# Patient Record
Sex: Male | Born: 1970
Health system: Southern US, Community
[De-identification: ages and names within clinical notes are randomized; demographics above are authoritative.]

## PROBLEM LIST (undated history)

## (undated) DIAGNOSIS — N529 Male erectile dysfunction, unspecified: Secondary | ICD-10-CM

## (undated) DIAGNOSIS — L659 Nonscarring hair loss, unspecified: Secondary | ICD-10-CM

## (undated) DIAGNOSIS — H9319 Tinnitus, unspecified ear: Secondary | ICD-10-CM

## (undated) DIAGNOSIS — E291 Testicular hypofunction: Secondary | ICD-10-CM

## (undated) DIAGNOSIS — M51369 Other intervertebral disc degeneration, lumbar region without mention of lumbar back pain or lower extremity pain: Secondary | ICD-10-CM

## (undated) DIAGNOSIS — G7111 Myotonic muscular dystrophy: Secondary | ICD-10-CM

## (undated) DIAGNOSIS — M5136 Other intervertebral disc degeneration, lumbar region: Secondary | ICD-10-CM

## (undated) DIAGNOSIS — R001 Bradycardia, unspecified: Secondary | ICD-10-CM

## (undated) DIAGNOSIS — M5126 Other intervertebral disc displacement, lumbar region: Secondary | ICD-10-CM

## (undated) DIAGNOSIS — E786 Lipoprotein deficiency: Secondary | ICD-10-CM

## (undated) DIAGNOSIS — K589 Irritable bowel syndrome without diarrhea: Secondary | ICD-10-CM

## (undated) HISTORY — PX: WISDOM TOOTH EXTRACTION: SHX21

## (undated) HISTORY — DX: Irritable bowel syndrome, unspecified: K58.9

## (undated) HISTORY — DX: Lipoprotein deficiency: E78.6

## (undated) HISTORY — DX: Bradycardia, unspecified: R00.1

## (undated) HISTORY — DX: Tinnitus, unspecified ear: H93.19

## (undated) HISTORY — PX: ROOT CANAL: SHX2363

## (undated) HISTORY — DX: Nonscarring hair loss, unspecified: L65.9

## (undated) HISTORY — PX: KNEE ARTHROSCOPY: SUR90

## (undated) HISTORY — DX: Other intervertebral disc degeneration, lumbar region without mention of lumbar back pain or lower extremity pain: M51.369

## (undated) HISTORY — DX: Male erectile dysfunction, unspecified: N52.9

## (undated) HISTORY — DX: Other intervertebral disc degeneration, lumbar region: M51.36

## (undated) HISTORY — DX: Testicular hypofunction: E29.1

## (undated) HISTORY — DX: Myotonic muscular dystrophy: G71.11

## (undated) HISTORY — PX: OTHER SURGICAL HISTORY: SHX169

## (undated) HISTORY — DX: Other intervertebral disc displacement, lumbar region: M51.26

---

## 2010-02-24 ENCOUNTER — Encounter (INDEPENDENT_AMBULATORY_CARE_PROVIDER_SITE_OTHER): Payer: Self-pay | Admitting: *Deleted

## 2010-02-24 ENCOUNTER — Ambulatory Visit (HOSPITAL_COMMUNITY): Payer: BC Managed Care – PPO | Attending: Neurology

## 2010-02-24 ENCOUNTER — Encounter: Payer: Self-pay | Admitting: Cardiology

## 2010-02-24 ENCOUNTER — Encounter (INDEPENDENT_AMBULATORY_CARE_PROVIDER_SITE_OTHER): Payer: BC Managed Care – PPO

## 2010-02-24 DIAGNOSIS — I059 Rheumatic mitral valve disease, unspecified: Secondary | ICD-10-CM | POA: Insufficient documentation

## 2010-02-24 DIAGNOSIS — I379 Nonrheumatic pulmonary valve disorder, unspecified: Secondary | ICD-10-CM | POA: Insufficient documentation

## 2010-02-24 DIAGNOSIS — G7111 Myotonic muscular dystrophy: Secondary | ICD-10-CM | POA: Insufficient documentation

## 2010-02-24 DIAGNOSIS — R9431 Abnormal electrocardiogram [ECG] [EKG]: Secondary | ICD-10-CM

## 2010-02-24 DIAGNOSIS — I079 Rheumatic tricuspid valve disease, unspecified: Secondary | ICD-10-CM | POA: Insufficient documentation

## 2010-03-04 NOTE — Assessment & Plan Note (Signed)
Summary: ekg. 359.21. [facility]. per dana office [phone]. dana w...  Nurse Visit  Comments EKG done, copy made for our records pt given orginal to take back to [facility] by Ledon Snare, RN

## 2013-04-02 ENCOUNTER — Other Ambulatory Visit: Payer: Self-pay | Admitting: Orthopedic Surgery

## 2013-04-02 DIAGNOSIS — M25562 Pain in left knee: Secondary | ICD-10-CM

## 2013-04-10 ENCOUNTER — Other Ambulatory Visit: Payer: Self-pay | Admitting: Orthopedic Surgery

## 2013-04-10 ENCOUNTER — Ambulatory Visit
Admission: RE | Admit: 2013-04-10 | Discharge: 2013-04-10 | Disposition: A | Discharge status: Home / Self Care | Payer: BC Managed Care – PPO | Source: Ambulatory Visit | Attending: Orthopedic Surgery | Admitting: Orthopedic Surgery

## 2013-04-10 DIAGNOSIS — T1590XA Foreign body on external eye, part unspecified, unspecified eye, initial encounter: Secondary | ICD-10-CM

## 2013-04-10 DIAGNOSIS — M25562 Pain in left knee: Secondary | ICD-10-CM

## 2013-04-30 ENCOUNTER — Telehealth: Payer: Self-pay | Admitting: Interventional Cardiology

## 2013-04-30 NOTE — Telephone Encounter (Signed)
New message  Pt called states that he is having knee surgery on the 05/08/2013. Will undergo anesthesia. Pt wanted Dr. Tamala Julian to be aware of the surgery because he has Myotonic dystrophy. Request a call back to discuss

## 2013-05-01 NOTE — Telephone Encounter (Signed)
returned pt call.pt sts that he has an upcoming knee surgery and wants Dr.Smith to know.pt sts that cardiac clearance was not requested by the surgeon, but with his condition of myotonic dystrophy. there is a concern.adv pt Dr.Smith will be back in the office early next week. I will fwd him a message.pt sts that his surgery is scheduled for 05/08/13 if cleared by Dr.Smith clearance should be faxed to [phone]

## 2013-05-01 NOTE — Telephone Encounter (Signed)
returned pt call.lmom  for pt to call back

## 2013-05-01 NOTE — Telephone Encounter (Signed)
Follow up     Pt is aware Dr Tamala Julian is out of the office until next week.  However, he wanted to know if someone else can say it is ok for him to have surgery and go under anesthesia even though he has myotonic dystrophy.

## 2013-05-07 NOTE — Telephone Encounter (Signed)
I don't know how to respond. Have not seen recently. If surgeon is okay with operating, I am too.

## 2013-05-09 NOTE — Telephone Encounter (Signed)
pt sts that he had the surgery and they surgeon gave local anesthesia.pt sts he is doing well.

## 2013-08-12 ENCOUNTER — Encounter: Payer: Self-pay | Admitting: Interventional Cardiology

## 2013-08-12 ENCOUNTER — Ambulatory Visit (INDEPENDENT_AMBULATORY_CARE_PROVIDER_SITE_OTHER): Payer: BC Managed Care – PPO | Admitting: Interventional Cardiology

## 2013-08-12 VITALS — BP 135/82 | HR 55 | Ht 70.0 in | Wt 190.8 lb

## 2013-08-12 DIAGNOSIS — N529 Male erectile dysfunction, unspecified: Secondary | ICD-10-CM

## 2013-08-12 DIAGNOSIS — G7111 Myotonic muscular dystrophy: Secondary | ICD-10-CM

## 2013-08-12 DIAGNOSIS — E786 Lipoprotein deficiency: Secondary | ICD-10-CM | POA: Insufficient documentation

## 2013-08-12 DIAGNOSIS — Z79899 Other long term (current) drug therapy: Secondary | ICD-10-CM

## 2013-08-12 NOTE — Patient Instructions (Signed)
Your physician recommends that you continue on your current medications as directed. Please refer to the Current Medication list given to you today.  Your physician wants you to follow-up in: 1 year with Dr.Smith You will receive a reminder letter in the mail two months in advance. If you don't receive a letter, please call our office to schedule the follow-up appointment.  

## 2013-08-12 NOTE — Progress Notes (Signed)
Patient ID: Don Grant, male   DOB: [birth year], 43 y.o.   MRN: [id number]    [address], [address] [place], [place]  [address] Phone: [phone] Fax:  [phone]  Date:  08/12/2013   ID:  [name], DOB Jul 30, 1970, MRN [id number]  PCP:  Vidal Schwalbe, MD   ASSESSMENT:  1. Mexiletine therapy. No EKG evidence of conduction abnormality 2. Myotonic dystrophy, stable 3. Sinus bradycardia  PLAN:  1. EKG in one year   SUBJECTIVE: Don Grant is a 43 y.o. male is asymptomatic from cardiac standpoint. Had a left knee arthroscopy and also endodontic surgery. He denies any specific complaints. No side effects to medications.   Wt Readings from Last 3 Encounters:  08/12/13 190 lb 12.8 oz (86.546 kg)     Past Medical History  Diagnosis Date  . Bradycardia     asymptomatic  . Drug therapy     no cardiac side effects or containdications  . IBS (irritable bowel syndrome)     diarrhea prone  . Balding     early ( saw Jarome Matin in the past chose not to stay on Propecia )  . Myotonic dystrophy, type 1     on Mexiletine followed  by Dr .Tamala Julian, and [facility] and at [facility]   . Low HDL (under 40)   . Tinnitus     (may be side effect of mexilitene)  . Erectile dysfunction     Current Outpatient Prescriptions  Medication Sig Dispense Refill  . Coenzyme Q10 (CO Q 10 PO) Take by mouth. Co Q 10 Mega Capsule 1 capsule daily      . fluticasone (FLONASE) 50 MCG/ACT nasal spray Place into both nostrils daily. As needed for allergies      . Melatonin 3 MG CAPS Take by mouth. Take 2 tablets every night      . mexiletine (MEXITIL) 150 MG capsule Take 150 mg by mouth 3 (three) times daily.      . Multiple Vitamin (MULTIVITAMIN) LIQD Take 5 mLs by mouth daily.      . Omega 3 1000 MG CAPS Take by mouth. Take 3 tablets      . sildenafil (VIAGRA) 100 MG tablet Take 100 mg by mouth daily as needed for erectile dysfunction.       No current facility-administered  medications for this visit.    Allergies:    Allergies  Allergen Reactions  . Phenytoin     Suicidal thoughts    Social History:  The patient  reports that he has never smoked. He does not have any smokeless tobacco history on file. He reports that he drinks alcohol.   ROS:  Please see the history of present illness.      All other systems reviewed and negative.   OBJECTIVE: VS:  BP 135/82  Pulse 55  Ht 5' 10"  (1.778 m)  Wt 190 lb 12.8 oz (86.546 kg)  BMI 27.38 kg/m2 Well nourished, well developed, in no acute distress, healthy-appearing HEENT: normal Neck: JVD flat. Carotid bruit absent  Cardiac:  normal S1, S2; RRR; no murmur Lungs:  clear to auscultation bilaterally, no wheezing, rhonchi or rales Abd: soft, nontender, no hepatomegaly Ext: Edema absent. Pulses 2+ bilateral Skin: warm and dry Neuro:  CNs 2-12 intact, no focal abnormalities noted  EKG:  Normal sinus rhythm/sinus bradycardia with minimal interventricular conduction delay       Signed, Illene Labrador III, MD 08/12/2013 2:57 PM

## 2014-09-01 ENCOUNTER — Ambulatory Visit: Payer: Self-pay | Admitting: Interventional Cardiology

## 2014-10-12 NOTE — Progress Notes (Signed)
No show

## 2014-10-16 ENCOUNTER — Ambulatory Visit: Payer: Self-pay | Admitting: Interventional Cardiology

## 2014-11-05 ENCOUNTER — Encounter: Payer: Self-pay | Admitting: Physician Assistant

## 2014-11-05 NOTE — Progress Notes (Signed)
Cardiology Office Note Date:  11/06/2014  Patient ID:  Don Grant, DOB 1970-03-04, MRN [id number] PCP:  Vidal Schwalbe, MD  Cardiologist:  Dr. Tamala Julian   Chief Complaint: f/u due to mexiletine  History of Present Illness: [name] is a 44 y.o. male with history of myotonic muscular dystrophy, low HDL, sinus bradycardia who presents for follow-up. He is followed by Dr. Tamala Julian due to being on mexiletine for his muscular dystrophy.  He was originally diagnosed with MMD around age 21 after he began having episodic jaw tightening and problems from his shoulders up. He was tried on muscle relaxers by neurology but it was only after his sister was diagnosed with myotonic muscular dystrophy that he was found to have the disease as well. He has had good relief of symptoms with mexiletine. He also has found this medicine has relieved all IBS symptoms as well. He remains physically fit and eats a good diet. He has not had any CP, SOB, palpitations, near-syncope or syncope.   Past Medical History  Diagnosis Date  . Sinus bradycardia     asymptomatic  . IBS (irritable bowel syndrome)     diarrhea prone  . Balding     early ( saw Jarome Matin in the past chose not to stay on Propecia )  . Myotonic dystrophy, type 1 ([place])     on Mexiletine followed  by Dr .Tamala Julian, and [facility] and at [facility]   . Low HDL (under 40)   . Tinnitus     (may be side effect of mexilitene)  . Erectile dysfunction     Past Surgical History  Procedure Laterality Date  . Wisdom tooth extraction    . Root canal    . Apico on tooth    . Knee arthroscopy      Current Outpatient Prescriptions  Medication Sig Dispense Refill  . Coenzyme Q10 (CO Q 10 PO) Take 1 capsule by mouth daily.     . fluticasone (FLONASE) 50 MCG/ACT nasal spray Place 2 sprays into both nostrils daily. As needed for allergies    . Glucosamine-Chondroit-Vit C-Mn (GLUCOSAMINE CHONDR 1500 COMPLX PO) Take 3 tablets by mouth daily.     . Melatonin 3 MG CAPS Take 1 capsule by mouth at bedtime.     . mexiletine (MEXITIL) 150 MG capsule Take 150 mg by mouth 3 (three) times daily.    . Multiple Vitamin (MULTIVITAMIN) LIQD Take 5 mLs by mouth daily.    . Omega 3 1000 MG CAPS Take 1 capsule by mouth daily. Take 3 tablets    . sildenafil (VIAGRA) 100 MG tablet Take 100 mg by mouth daily as needed for erectile dysfunction.     No current facility-administered medications for this visit.    Allergies:   Phenytoin   Social History:  The patient  reports that he has never smoked. He does not have any smokeless tobacco history on file. He reports that he drinks alcohol.   Family History:  The patient's family history includes Arthritis in his maternal grandfather; CAD in his maternal grandmother; Cataracts in his maternal grandfather and maternal grandmother; Depression in his mother; Healthy in his daughter and father; Heart Problems in his maternal grandfather; Muscular dystrophy in his sister; Myopathy in his sister; Obesity in his mother; Other in his paternal grandmother.  ROS:  Please see the history of present illness.  All other systems are reviewed and otherwise negative.   PHYSICAL EXAM:  VS:  BP 128/66  mmHg  Pulse 45  Ht 5' 10"  (1.778 m)  Wt 185 lb 9.6 oz (84.188 kg)  BMI 26.63 kg/m2 BMI: Body mass index is 26.63 kg/(m^2). Well nourished, well developed fit appearing WM, in no acute distress HEENT: normocephalic, atraumatic Neck: no JVD, carotid bruits or masses Cardiac:  normal S1, S2; RRR; no murmurs, rubs, or gallops Lungs:  clear to auscultation bilaterally, no wheezing, rhonchi or rales Abd: soft, nontender, no hepatomegaly, + BS MS: no deformity or atrophy Ext: no edema Skin: warm and dry, no rash Neuro:  moves all extremities spontaneously, no focal abnormalities noted, follows commands Psych: euthymic mood, full affect  EKG:  Done today shows sinus bradycardia 45bpm, QTc 456m, PR 1829m TWI III, no  acute ST-T changes  Recent Labs: No results found for requested labs within last 365 days.  No results found for requested labs within last 365 days.   CrCl cannot be calculated (Patient has no serum creatinine result on file.).   Wt Readings from Last 3 Encounters:  11/06/14 185 lb 9.6 oz (84.188 kg)  08/12/13 190 lb 12.8 oz (86.546 kg)     Other studies reviewed: Additional studies/records reviewed today include: summarized above  ASSESSMENT AND PLAN:  1. Myotonic dystrophy - maintained on mexiletine with good clinical outcome. No cardiovascular symptoms reported. Has not had recent labwork since earlier this year. Will check BMET and Mg to make sure we are near goal of K>4, Mg >1.8. I also discussed the patient's case with Dr. AlRayann Hemanho recommends a baseline echocardiogram since is on this medicine. The patient and I discussed observation for symptoms of palpitations and dizziness/syncope while taking this medicine - he will let usKoreanow if he develops any problems. 2. Sinus bradycardia - chronic, completely asymptomatic. Follow. 3. H/o low HDL - treated with fish oil and followed by primary care.  Disposition: F/u with Dr. SmTamala Juliann 1 year.  Current medicines are reviewed at length with the patient today.  The patient did not have any concerns regarding medicines.  SiRaechel AcheA-C 11/06/2014 11:19 AM     [facility] [address] [place] [phone])  ([phone])

## 2014-11-06 ENCOUNTER — Encounter: Payer: Self-pay | Admitting: *Deleted

## 2014-11-06 ENCOUNTER — Ambulatory Visit (INDEPENDENT_AMBULATORY_CARE_PROVIDER_SITE_OTHER): Payer: 59 | Admitting: Physician Assistant

## 2014-11-06 DIAGNOSIS — E786 Lipoprotein deficiency: Secondary | ICD-10-CM

## 2014-11-06 DIAGNOSIS — R001 Bradycardia, unspecified: Secondary | ICD-10-CM | POA: Diagnosis not present

## 2014-11-06 DIAGNOSIS — G7111 Myotonic muscular dystrophy: Secondary | ICD-10-CM | POA: Diagnosis not present

## 2014-11-06 LAB — BASIC METABOLIC PANEL
BUN: 29 mg/dL — ABNORMAL HIGH (ref 7–25)
CO2: 28 mmol/L (ref 20–31)
Calcium: 9.4 mg/dL (ref 8.6–10.3)
Chloride: 105 mmol/L (ref 98–110)
Creat: 1.11 mg/dL (ref 0.60–1.35)
GLUCOSE: 85 mg/dL (ref 65–99)
POTASSIUM: 4.2 mmol/L (ref 3.5–5.3)
SODIUM: 142 mmol/L (ref 135–146)

## 2014-11-06 LAB — MAGNESIUM: Magnesium: 2.3 mg/dL (ref 1.5–2.5)

## 2014-11-06 NOTE — Patient Instructions (Addendum)
Medication Instructions:  Your physician recommends that you continue on your current medications as directed. Please refer to the Current Medication list given to you today.   Labwork: TODAY:  BMET                MAGNESIUM  Testing/Procedures: Your physician has requested that you have an echocardiogram. Echocardiography is a painless test that uses sound waves to create images of your heart. It provides your doctor with information about the size and shape of your heart and how well your heart's chambers and valves are working. This procedure takes approximately one hour. There are no restrictions for this procedure.   Follow-Up: Your physician wants you to follow-up in:  [place].  You will receive a reminder letter in the mail two months in advance. If you don't receive a letter, please call our office to schedule the follow-up appointment.   Special Information:  Echocardiogram An echocardiogram, or echocardiography, uses sound waves (ultrasound) to produce an image of your heart. The echocardiogram is simple, painless, obtained within a short period of time, and offers valuable information to your health care provider. The images from an echocardiogram can provide information such as:  Evidence of coronary artery disease (CAD).  Heart size.  Heart muscle function.  Heart valve function.  Aneurysm detection.  Evidence of a past heart attack.  Fluid buildup around the heart.  Heart muscle thickening.  Assess heart valve function. LET [facility] CARE PROVIDER KNOW ABOUT:  Any allergies you have.  All medicines you are taking, including vitamins, herbs, eye drops, creams, and over-the-counter medicines.  Previous problems you or members of your family have had with the use of anesthetics.  Any blood disorders you have.  Previous surgeries you have had.  Medical conditions you have.  Possibility of pregnancy, if this applies. BEFORE THE PROCEDURE   No special preparation is needed. Eat and drink normally.  PROCEDURE   In order to produce an image of your heart, gel will be applied to your chest and a wand-like tool (transducer) will be moved over your chest. The gel will help transmit the sound waves from the transducer. The sound waves will harmlessly bounce off your heart to allow the heart images to be captured in real-time motion. These images will then be recorded.  You may need an IV to receive a medicine that improves the quality of the pictures. AFTER THE PROCEDURE You may return to your normal schedule including diet, activities, and medicines, unless your health care provider tells you otherwise.   This information is not intended to replace advice given to you by your health care provider. Make sure you discuss any questions you have with your health care provider.   Document Released: 01/01/2000 Document Revised: 01/24/2014 Document Reviewed: 09/10/2012 Elsevier Interactive Patient Education [employer].

## 2014-11-18 ENCOUNTER — Ambulatory Visit (HOSPITAL_COMMUNITY): Payer: 59 | Attending: Physician Assistant

## 2014-11-18 ENCOUNTER — Other Ambulatory Visit: Payer: Self-pay

## 2014-11-18 DIAGNOSIS — G7111 Myotonic muscular dystrophy: Secondary | ICD-10-CM | POA: Insufficient documentation

## 2014-11-18 DIAGNOSIS — I517 Cardiomegaly: Secondary | ICD-10-CM | POA: Diagnosis not present

## 2014-11-27 ENCOUNTER — Other Ambulatory Visit: Payer: Self-pay | Admitting: Family Medicine

## 2014-11-27 ENCOUNTER — Ambulatory Visit
Admission: RE | Admit: 2014-11-27 | Discharge: 2014-11-27 | Disposition: A | Discharge status: Home / Self Care | Payer: 59 | Source: Ambulatory Visit | Attending: Family Medicine | Admitting: Family Medicine

## 2014-11-27 DIAGNOSIS — R05 Cough: Secondary | ICD-10-CM

## 2014-11-27 DIAGNOSIS — R059 Cough, unspecified: Secondary | ICD-10-CM

## 2014-11-28 ENCOUNTER — Telehealth: Payer: Self-pay | Admitting: Interventional Cardiology

## 2014-11-28 NOTE — Telephone Encounter (Signed)
Notified of echo results.

## 2014-11-28 NOTE — Telephone Encounter (Signed)
New problem   Pt want to know results of Echo he had done 11.1.16. Please advise

## 2015-11-16 ENCOUNTER — Other Ambulatory Visit: Payer: Self-pay | Admitting: Orthopedic Surgery

## 2015-11-16 DIAGNOSIS — M545 Low back pain, unspecified: Secondary | ICD-10-CM

## 2015-11-16 DIAGNOSIS — G8929 Other chronic pain: Secondary | ICD-10-CM

## 2015-11-21 ENCOUNTER — Ambulatory Visit
Admission: RE | Admit: 2015-11-21 | Discharge: 2015-11-21 | Disposition: A | Discharge status: Home / Self Care | Payer: 59 | Source: Ambulatory Visit | Attending: Orthopedic Surgery | Admitting: Orthopedic Surgery

## 2015-11-21 DIAGNOSIS — G8929 Other chronic pain: Secondary | ICD-10-CM

## 2015-11-21 DIAGNOSIS — M545 Low back pain, unspecified: Secondary | ICD-10-CM

## 2015-12-02 DIAGNOSIS — Z5181 Encounter for therapeutic drug level monitoring: Secondary | ICD-10-CM | POA: Insufficient documentation

## 2015-12-03 ENCOUNTER — Encounter (INDEPENDENT_AMBULATORY_CARE_PROVIDER_SITE_OTHER): Payer: Self-pay

## 2015-12-03 ENCOUNTER — Ambulatory Visit (INDEPENDENT_AMBULATORY_CARE_PROVIDER_SITE_OTHER): Payer: 59 | Admitting: Interventional Cardiology

## 2015-12-03 ENCOUNTER — Encounter: Payer: Self-pay | Admitting: Interventional Cardiology

## 2015-12-03 VITALS — BP 122/80 | HR 47 | Ht 70.0 in | Wt 181.4 lb

## 2015-12-03 DIAGNOSIS — R001 Bradycardia, unspecified: Secondary | ICD-10-CM

## 2015-12-03 DIAGNOSIS — G7111 Myotonic muscular dystrophy: Secondary | ICD-10-CM

## 2015-12-03 DIAGNOSIS — Z5181 Encounter for therapeutic drug level monitoring: Secondary | ICD-10-CM | POA: Diagnosis not present

## 2015-12-03 DIAGNOSIS — N5201 Erectile dysfunction due to arterial insufficiency: Secondary | ICD-10-CM | POA: Diagnosis not present

## 2015-12-03 DIAGNOSIS — E786 Lipoprotein deficiency: Secondary | ICD-10-CM

## 2015-12-03 NOTE — Patient Instructions (Signed)
Medication Instructions:  None  Labwork: None  Testing/Procedures: None  Follow-Up: Your physician wants you to follow-up in: 1 year with Dr. Tamala Julian.  You will receive a reminder letter in the mail two months in advance. If you don't receive a letter, please call our office to schedule the follow-up appointment.   Any Other Special Instructions Will Be Listed Below (If Applicable).     If you need a refill on your cardiac medications before your next appointment, please call your pharmacy.

## 2015-12-03 NOTE — Progress Notes (Signed)
Cardiology Office Note    Date:  12/03/2015   ID:  Don Grant, DOB 04/02/1970, MRN [id number]  PCP:  Don Schwalbe, MD  Cardiologist: Sinclair Grooms, MD   Chief Complaint  Patient presents with  . Follow-up    Mexitil    History of Present Illness:  Don Grant is a 45 y.o. male with history of myotonic muscular dystrophy, low HDL, sinus bradycardia who presents for follow-up. Follow-up is for monitoring of Mexitil therapy  He is doing well. He has had some hormonal issues. He is found that testosterone levels are low. He has gone through many types of diet changes.   Past Medical History:  Diagnosis Date  . Balding    early ( saw Jarome Grant in the past chose not to stay on Propecia )  . Erectile dysfunction   . IBS (irritable bowel syndrome)    diarrhea prone  . Low HDL (under 40)   . Myotonic dystrophy, type 1 ([place])    on Mexiletine followed  by Dr .Tamala Julian, and [facility] and at [facility]   . Sinus bradycardia    asymptomatic  . Tinnitus    (may be side effect of mexilitene)    Past Surgical History:  Procedure Laterality Date  . APICO ON TOOTH    . KNEE ARTHROSCOPY    . ROOT CANAL    . WISDOM TOOTH EXTRACTION      Current Medications: Outpatient Medications Prior to Visit  Medication Sig Dispense Refill  . Coenzyme Q10 (CO Q 10 PO) Take 1 capsule by mouth daily.     . fluticasone (FLONASE) 50 MCG/ACT nasal spray Place 2 sprays into both nostrils daily. As needed for allergies    . Glucosamine-Chondroit-Vit C-Mn (GLUCOSAMINE CHONDR 1500 COMPLX PO) Take 3 tablets by mouth daily.    . Melatonin 3 MG CAPS Take 1 capsule by mouth at bedtime.     . mexiletine (MEXITIL) 150 MG capsule Take 150 mg by mouth 3 (three) times daily.    . Multiple Vitamin (MULTIVITAMIN) LIQD Take 5 mLs by mouth daily.    . sildenafil (VIAGRA) 100 MG tablet Take 100 mg by mouth daily as needed for erectile dysfunction.    . Omega 3 1000 MG CAPS Take 1 capsule by  mouth daily. Take 3 tablets     No facility-administered medications prior to visit.      Allergies:   Phenytoin   Social History   Social History  . Marital status: Married    Spouse name: N/A  . Number of children: 2  . Years of education: N/A   Social History Main Topics  . Smoking status: Never Smoker  . Smokeless tobacco: Never Used  . Alcohol use Yes     Comment: patient drinks 1 glass of red wine every night  . Drug use: Unknown  . Sexual activity: Not Asked   Other Topics Concern  . None   Social History Narrative   Tobacco use    Cigarettes: never smoked    No smoking   Alcohol : yes,red wine-8oz every night    Recreational drug use :no   Exercise: cardio 3-4 times per week,30 -50 minutes   Strength training 3 times per week.   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   ROS:   Please see the history of present illness.    Chronic back pain. Myotonic dystrophy.  All other systems reviewed and are negative.   PHYSICAL EXAM:   VS:  BP 122/80   Pulse (!) 47   Ht 5' 10"  (1.778 m)   Wt 181 lb 6.4 oz (82.3 kg)   BMI 26.03 kg/m    GEN: Well nourished, well developed, in no acute distress  HEENT: normal  Neck: no JVD, carotid bruits, or masses Cardiac: RRR; no murmurs, rubs, or gallops,no  edema  Respiratory:  clear to auscultation bilaterally, normal work of breathing GI: soft, nontender, nondistended, + BS MS: no deformity or atrophy  Skin: warm and dry, no rash Neuro:  Alert and Oriented x 3, Strength and sensation are intact Psych: euthymic mood, full affect  Wt Readings from Last 3 Encounters:  12/03/15 181 lb 6.4 oz (82.3 kg)  11/06/14 185 lb 9.6 oz (84.2 kg)  08/12/13 190 lb 12.8 oz (86.5 kg)      Studies/Labs Reviewed:   EKG:  EKG  Sinus bradycardia at 47 bpm. Otherwise unremarkable. No change compared to prior.  Recent Labs: No results found for requested labs within last 8760 hours.   Lipid Panel No results found for: CHOL, TRIG, HDL, CHOLHDL, VLDL, LDLCALC, LDLDIRECT  Additional studies/ records that were reviewed today include:  No new data    ASSESSMENT:    1. Myotonic muscular dystrophy ([place])   2. Medication monitoring encounter   3. Erectile dysfunction due to arterial insufficiency   4. Low HDL (under 40)      PLAN:  In order of problems listed above:  1. No evidence of medication side effect or toxicity. Continue an active lifestyle. Call if syncope, palpitations, or other complaints. 2. Partially contributed to by Mexitil (potential). He has a high vagal tone as well, related to physical training.    Medication Adjustments/Labs and Tests Ordered: Current medicines are reviewed at length with the patient today.  Concerns regarding medicines are outlined above.  Medication changes, Labs and Tests ordered today are listed in the Patient Instructions below. There are no Patient Instructions on file for this visit.   Signed, Sinclair Grooms, MD  12/03/2015 10:09 AM    [facility] [address], [place], [place]  [address] Phone: [phone]; Fax: [phone]

## 2016-02-09 ENCOUNTER — Other Ambulatory Visit: Payer: Self-pay | Admitting: Neurological Surgery

## 2016-02-09 DIAGNOSIS — M5136 Other intervertebral disc degeneration, lumbar region: Secondary | ICD-10-CM

## 2016-02-12 ENCOUNTER — Ambulatory Visit
Admission: RE | Admit: 2016-02-12 | Discharge: 2016-02-12 | Disposition: A | Discharge status: Home / Self Care | Payer: 59 | Source: Ambulatory Visit | Attending: Neurological Surgery | Admitting: Neurological Surgery

## 2016-02-12 DIAGNOSIS — M5136 Other intervertebral disc degeneration, lumbar region: Secondary | ICD-10-CM | POA: Diagnosis not present

## 2016-02-12 MED ORDER — METHYLPREDNISOLONE ACETATE 40 MG/ML INJ SUSP (RADIOLOG
120.0000 mg | Freq: Once | INTRAMUSCULAR | Status: AC
  Administered 2016-02-12: 120 mg via EPIDURAL

## 2016-02-12 MED ORDER — IOPAMIDOL (ISOVUE-M 200) INJECTION 41%
1.0000 mL | Freq: Once | INTRAMUSCULAR | Status: AC
  Administered 2016-02-12: 1 mL via EPIDURAL

## 2016-02-12 NOTE — Discharge Instructions (Signed)

## 2016-04-08 DIAGNOSIS — E291 Testicular hypofunction: Secondary | ICD-10-CM | POA: Diagnosis not present

## 2016-04-08 DIAGNOSIS — Z79899 Other long term (current) drug therapy: Secondary | ICD-10-CM | POA: Diagnosis not present

## 2016-04-08 DIAGNOSIS — Z0000 Encounter for general adult medical examination without abnormal findings: Secondary | ICD-10-CM | POA: Diagnosis not present

## 2016-04-08 DIAGNOSIS — Z125 Encounter for screening for malignant neoplasm of prostate: Secondary | ICD-10-CM | POA: Diagnosis not present

## 2016-08-09 DIAGNOSIS — R030 Elevated blood-pressure reading, without diagnosis of hypertension: Secondary | ICD-10-CM | POA: Diagnosis not present

## 2016-10-03 IMAGING — CR DG CHEST 2V
2 series · 2 of 2 positions shown · non-contrast
Comparison: 11/12/2007.

CLINICAL DATA: Cough.

EXAM:
CHEST  2 VIEW

[w chest pa]
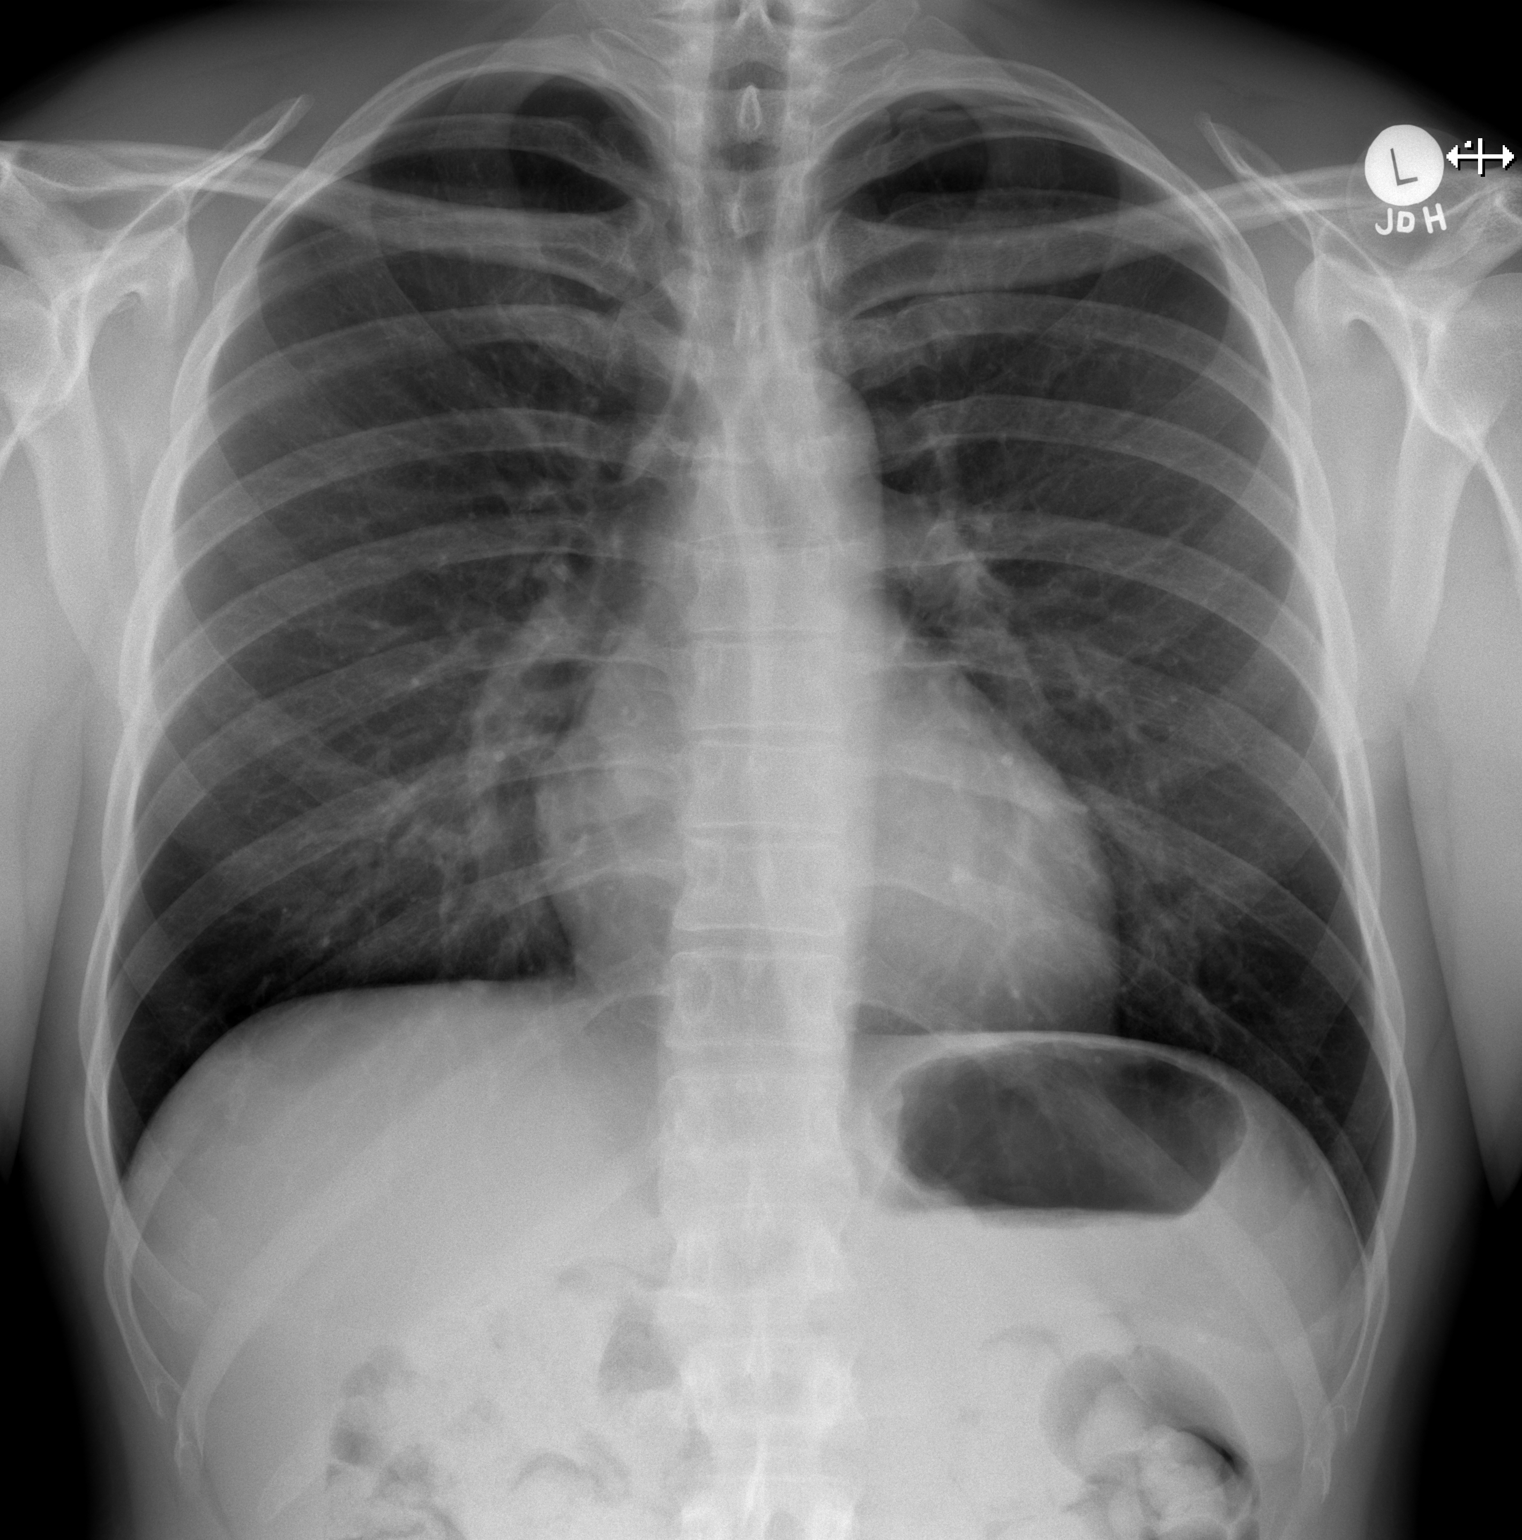

[w chest lat]
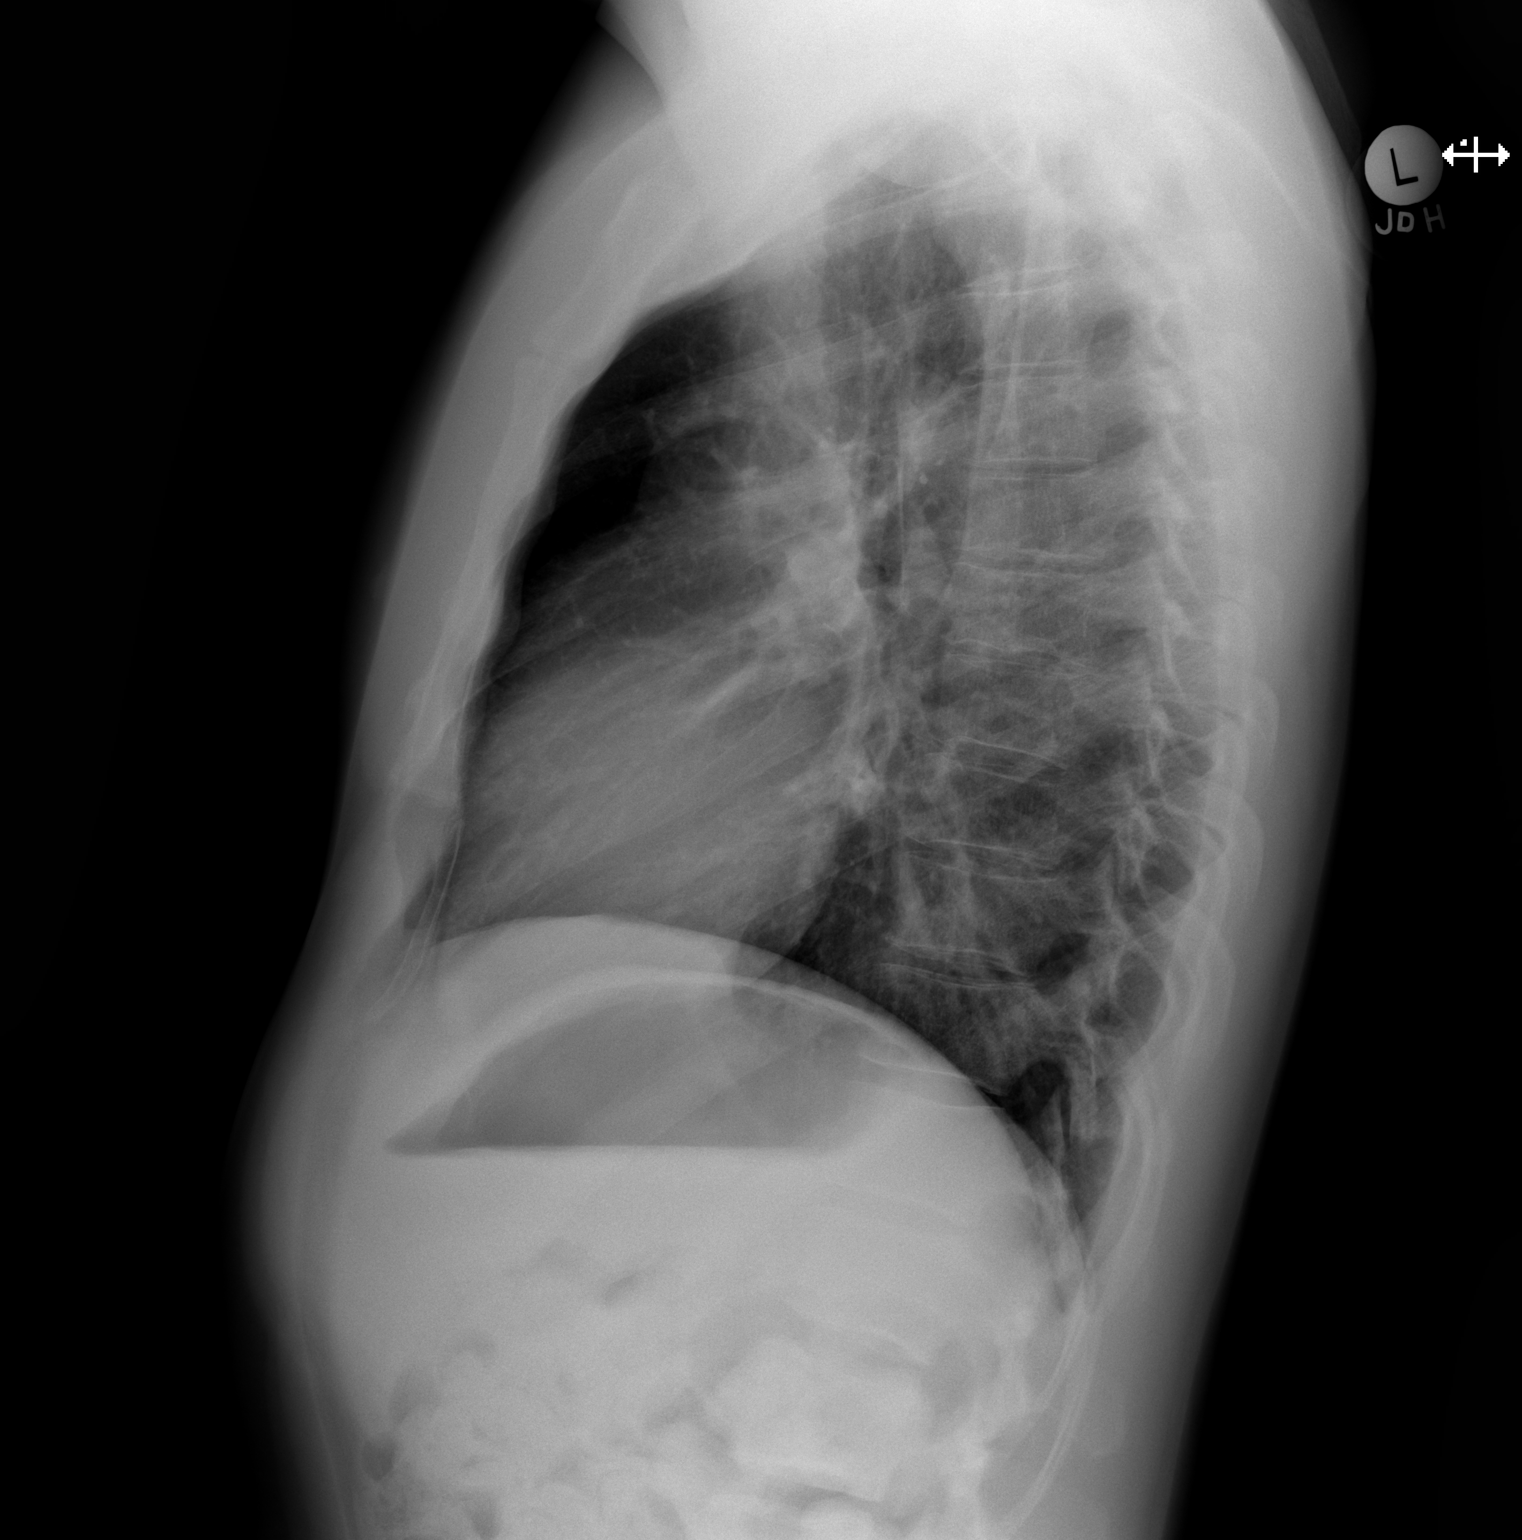

[2 of 2 positions shown; findings below may reference images not displayed]

FINDINGS: Mediastinum and hilar structures normal. Lungs are clear. No pleural
effusion or pneumothorax. Borderline cardiomegaly. Normal pulmonary
vascularity. No acute bony abnormality .
IMPRESSION: 1. Borderline cardiomegaly.
2. No acute pulmonary disease.

## 2016-10-11 DIAGNOSIS — Z79899 Other long term (current) drug therapy: Secondary | ICD-10-CM | POA: Diagnosis not present

## 2016-10-11 DIAGNOSIS — Z23 Encounter for immunization: Secondary | ICD-10-CM | POA: Diagnosis not present

## 2016-10-11 DIAGNOSIS — E291 Testicular hypofunction: Secondary | ICD-10-CM | POA: Diagnosis not present

## 2016-10-11 DIAGNOSIS — F5101 Primary insomnia: Secondary | ICD-10-CM | POA: Diagnosis not present

## 2016-11-28 ENCOUNTER — Encounter: Payer: Self-pay | Admitting: Interventional Cardiology

## 2016-12-11 NOTE — Progress Notes (Signed)
Cardiology Office Note    Date:  12/12/2016   ID:  Don Grant, DOB [birth year], MRN [id number]  PCP:  Harlan Stains, MD  Cardiologist: Sinclair Grooms, MD   Chief Complaint  Patient presents with  . Follow-up    Mexitil    History of Present Illness:  Don Grant is a 46 y.o. male with history of myotonic muscular dystrophy, low HDL, sinus bradycardia who presents for follow-up. Follow-up is for monitoring of Mexitil therapy   He voices no complaints.  Current medical regimen continues to include mexiletine 150 mg 3 times per day.  History of myotonic dystrophy.   Past Medical History:  Diagnosis Date  . Balding    early ( saw Don Grant in the past chose not to stay on Propecia )  . Erectile dysfunction   . IBS (irritable bowel syndrome)    diarrhea prone  . Low HDL (under 40)   . Myotonic dystrophy, type 1 ([place])    on Mexiletine followed  by Dr .Tamala Julian, and [facility] and at [facility]   . Sinus bradycardia    asymptomatic  . Tinnitus    (may be side effect of mexilitene)    Past Surgical History:  Procedure Laterality Date  . APICO ON TOOTH    . KNEE ARTHROSCOPY    . ROOT CANAL    . WISDOM TOOTH EXTRACTION      Current Medications: Outpatient Medications Prior to Visit  Medication Sig Dispense Refill  . Cholecalciferol (VITAMIN D) 2000 units CAPS Take 2,000 Units by mouth daily.    . Coenzyme Q10 (CO Q 10 PO) Take 1 capsule by mouth daily.     . DOCOSAHEXAENOIC ACID PO Take 4 capsules by mouth daily.    . fluticasone (FLONASE) 50 MCG/ACT nasal spray Place 2 sprays into both nostrils daily. As needed for allergies    . Glucosamine-Chondroit-Vit C-Mn (GLUCOSAMINE CHONDR 1500 COMPLX PO) Take 3 tablets by mouth daily.    . Magnesium 200 MG TABS Take 400 mg by mouth daily.    . Melatonin 3 MG CAPS Take 1 capsule by mouth at bedtime.     . mexiletine (MEXITIL) 150 MG capsule Take 150 mg by mouth 3 (three) times daily.    . Multiple Vitamin  (MULTIVITAMIN) LIQD Take 5 mLs by mouth daily.    Marland Kitchen PRESCRIPTION MEDICATION Apply 1 application topically daily. Testosterone Cream    . sildenafil (VIAGRA) 100 MG tablet Take 100 mg by mouth daily as needed for erectile dysfunction.     No facility-administered medications prior to visit.      Allergies:   Phenytoin   Social History   Socioeconomic History  . Marital status: Married    Spouse name: Not on file  . Number of children: 2  . Years of education: Not on file  . Highest education level: Not on file  Social Needs  . Financial resource strain: Not on file  . Food insecurity - worry: Not on file  . Food insecurity - inability: Not on file  . Transportation needs - medical: Not on file  . Transportation needs - non-medical: Not on file  Occupational History  . Not on file  Tobacco Use  . Smoking status: Never Smoker  . Smokeless tobacco: Never Used  Substance and Sexual Activity  . Alcohol use: Yes    Comment: patient drinks 1 glass of red wine every night  . Drug use: No  . Sexual activity:  Not on file  Other Topics Concern  . Not on file  Social History Narrative   Tobacco use    Cigarettes: never smoked    No smoking   Alcohol : yes,red wine-8oz every night    Recreational drug use :no   Exercise: cardio 3-4 times per week,30 -50 minutes   Strength training 3 times per week.   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   ROS:   Please see the history of present illness.    Have a musculoskeletal difficulty including lumbar disc disease, right shoulder discomfort, has myotonic dystrophy. All other systems reviewed and are negative.   PHYSICAL EXAM:   VS:  BP 116/74   Pulse (!) 53   Ht 5' 10"  (1.778 m)   Wt 186 lb (84.4 kg)   BMI 26.69 kg/m    GEN: Well nourished, well developed, in no acute distress  HEENT: normal  Neck: no JVD, carotid bruits, or masses Cardiac: RRR; no murmurs, rubs, or gallops,no edema  Respiratory:  clear to auscultation bilaterally, normal work of breathing GI: soft, nontender, nondistended, + BS MS: no deformity or atrophy  Skin: warm and dry, no rash Neuro:  Alert and Oriented x 3, Strength and sensation are intact Psych: euthymic mood, full affect  Wt Readings from Last 3 Encounters:  12/12/16 186 lb (84.4 kg)  12/03/15 181 lb 6.4 oz (82.3 kg)  11/06/14 185 lb 9.6 oz (84.2 kg)      Studies/Labs Reviewed:   EKG:  EKG sinus bradycardia otherwise within normal limits.  Recent Labs: No results found for requested labs within last 8760 hours.   Lipid Panel No results found for: CHOL, TRIG, HDL, CHOLHDL, VLDL, LDLCALC, LDLDIRECT  Additional studies/ records that were reviewed today include:  None    ASSESSMENT:    1. Myotonic muscular dystrophy ([place])   2. Bradycardia   3. Low HDL (under 40)      PLAN:  In order of problems listed above:  1. Stable and being followed it neurology. 2. Unchanged.  No EKG evidence of toxicities of Mexitil. 3. Addressed by primary care   Clinical follow-up in 1 year.    Medication Adjustments/Labs and Tests Ordered: Current medicines are reviewed at length with the patient today.  Concerns regarding medicines are outlined above.  Medication changes, Labs and Tests ordered today are listed  in the Patient Instructions below. Patient Instructions  Medication Instructions:  Your physician recommends that you continue on your current medications as directed. Please refer to the Current Medication list given to you today.  Labwork: None  Testing/Procedures: None  Follow-Up: Your physician wants you to follow-up in: 1 year with Dr. Tamala Julian.  You will receive a reminder letter in the mail two months in advance. If  you don't receive a letter, please call our office to schedule the follow-up appointment.   Any Other Special Instructions Will Be Listed Below (If Applicable).     If you need a refill on your cardiac medications before your next appointment, please call your pharmacy.      Signed, Sinclair Grooms, MD  12/12/2016 8:45 AM    [facility] [address], [place], [place]  [address] Phone: [phone]; Fax: [phone]

## 2016-12-12 ENCOUNTER — Encounter: Payer: Self-pay | Admitting: Interventional Cardiology

## 2016-12-12 ENCOUNTER — Ambulatory Visit: Payer: 59 | Admitting: Interventional Cardiology

## 2016-12-12 VITALS — BP 116/74 | HR 53 | Ht 70.0 in | Wt 186.0 lb

## 2016-12-12 DIAGNOSIS — R001 Bradycardia, unspecified: Secondary | ICD-10-CM | POA: Diagnosis not present

## 2016-12-12 DIAGNOSIS — E786 Lipoprotein deficiency: Secondary | ICD-10-CM

## 2016-12-12 DIAGNOSIS — G7111 Myotonic muscular dystrophy: Secondary | ICD-10-CM | POA: Diagnosis not present

## 2016-12-12 NOTE — Patient Instructions (Signed)

## 2017-01-23 DIAGNOSIS — E291 Testicular hypofunction: Secondary | ICD-10-CM | POA: Diagnosis not present

## 2017-04-19 DIAGNOSIS — E786 Lipoprotein deficiency: Secondary | ICD-10-CM | POA: Diagnosis not present

## 2017-04-19 DIAGNOSIS — Z0000 Encounter for general adult medical examination without abnormal findings: Secondary | ICD-10-CM | POA: Diagnosis not present

## 2017-04-19 DIAGNOSIS — Z79899 Other long term (current) drug therapy: Secondary | ICD-10-CM | POA: Diagnosis not present

## 2017-04-19 DIAGNOSIS — E291 Testicular hypofunction: Secondary | ICD-10-CM | POA: Diagnosis not present

## 2017-05-23 DIAGNOSIS — E349 Endocrine disorder, unspecified: Secondary | ICD-10-CM | POA: Diagnosis not present

## 2017-05-23 DIAGNOSIS — R3915 Urgency of urination: Secondary | ICD-10-CM | POA: Diagnosis not present

## 2017-07-12 DIAGNOSIS — D171 Benign lipomatous neoplasm of skin and subcutaneous tissue of trunk: Secondary | ICD-10-CM | POA: Diagnosis not present

## 2017-08-04 DIAGNOSIS — R471 Dysarthria and anarthria: Secondary | ICD-10-CM | POA: Diagnosis not present

## 2017-08-29 DIAGNOSIS — R471 Dysarthria and anarthria: Secondary | ICD-10-CM | POA: Diagnosis not present

## 2017-09-05 DIAGNOSIS — E349 Endocrine disorder, unspecified: Secondary | ICD-10-CM | POA: Diagnosis not present

## 2017-09-05 DIAGNOSIS — R3915 Urgency of urination: Secondary | ICD-10-CM | POA: Diagnosis not present

## 2017-09-08 DIAGNOSIS — M25562 Pain in left knee: Secondary | ICD-10-CM | POA: Diagnosis not present

## 2017-09-27 IMAGING — MR MR LUMBAR SPINE W/O CM
4 of 5 series · 26 of 48 positions shown · non-contrast
Comparison: None.

CLINICAL DATA: Low back pain. Pain radiates to left buttocks and
left leg.

EXAM:
MRI LUMBAR SPINE WITHOUT CONTRAST
TECHNIQUE: Multiplanar, multisequence MR imaging of the lumbar spine was
performed. No intravenous contrast was administered.

[Series 4: T1 · sagittal · 4.0mm · 0.55mm/px · 6 of 13 slices shown (1 of 2)]
[im 1/13]
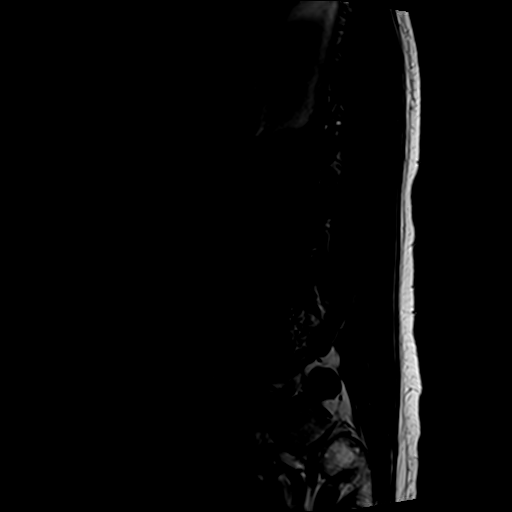
[im 3/13]
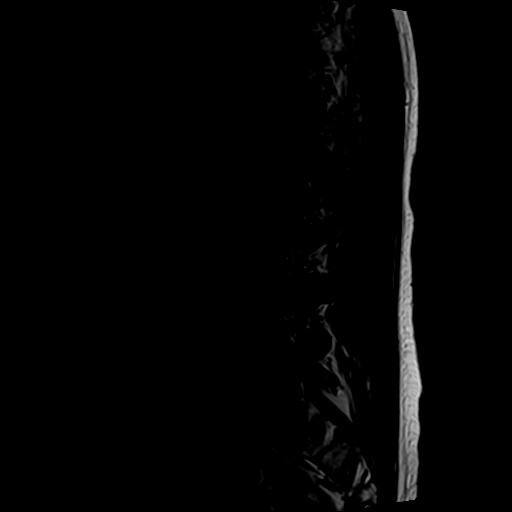
[im 5/13]
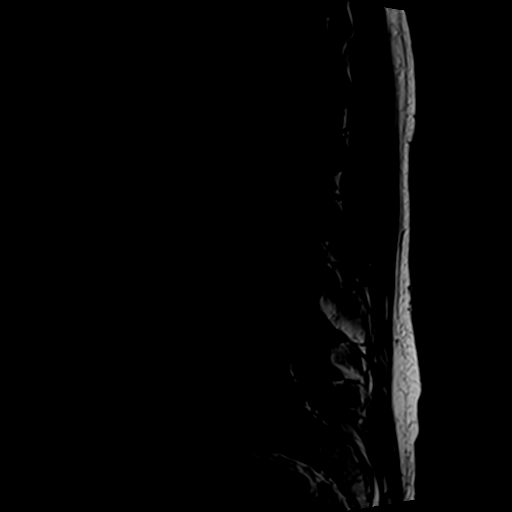
[im 8/13]
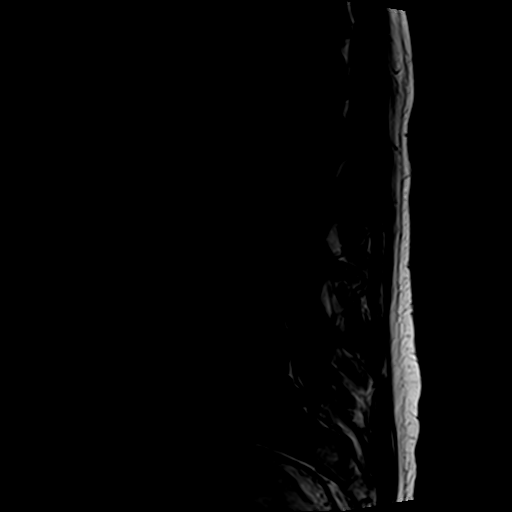
[im 10/13]
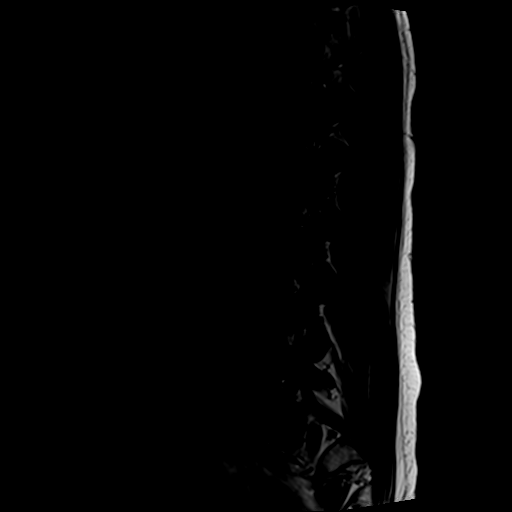
[im 13/13]
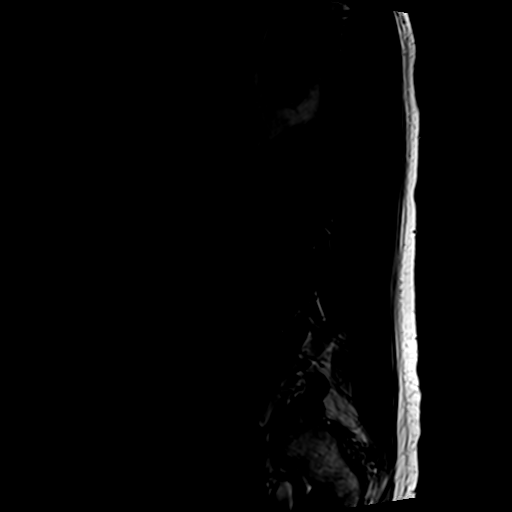

[Series 5: T2 post-contrast · sagittal · 4.0mm · 0.55mm/px · 6 of 13 slices shown]
[im 1/13]
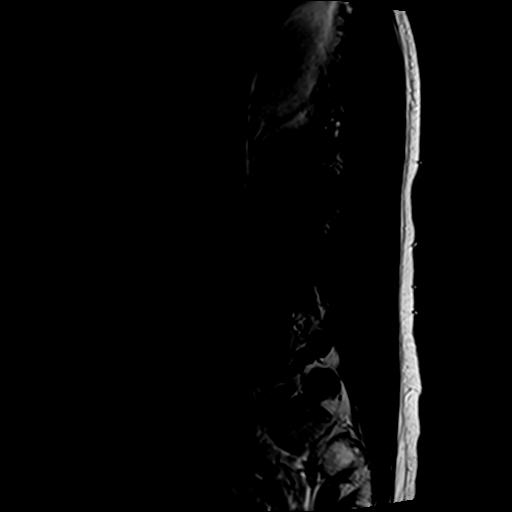
[im 3/13]
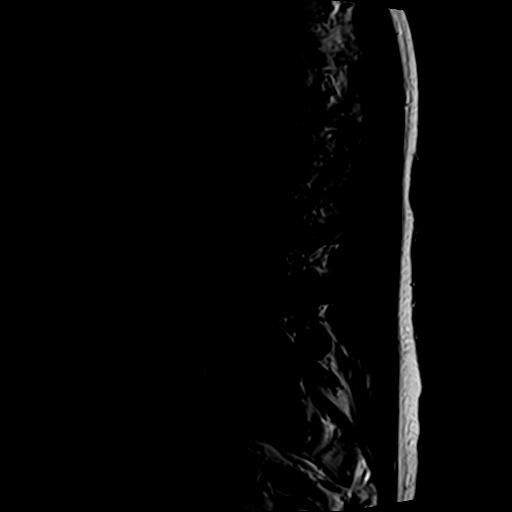
[im 5/13]
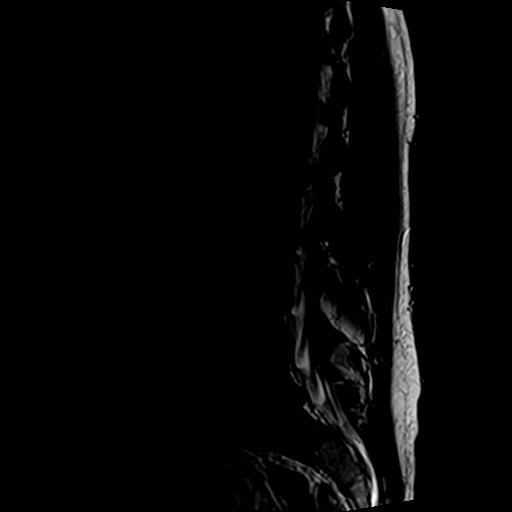
[im 8/13]
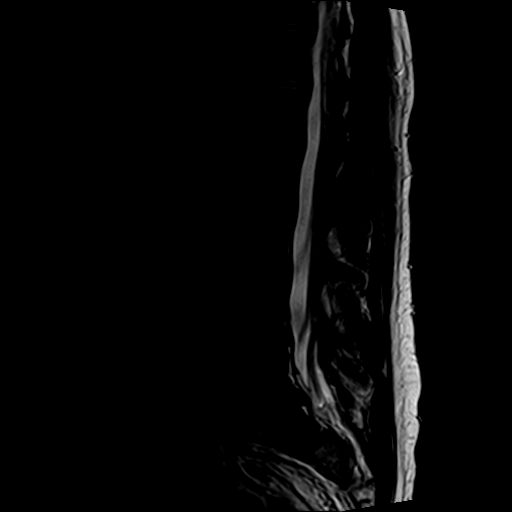
[im 10/13]
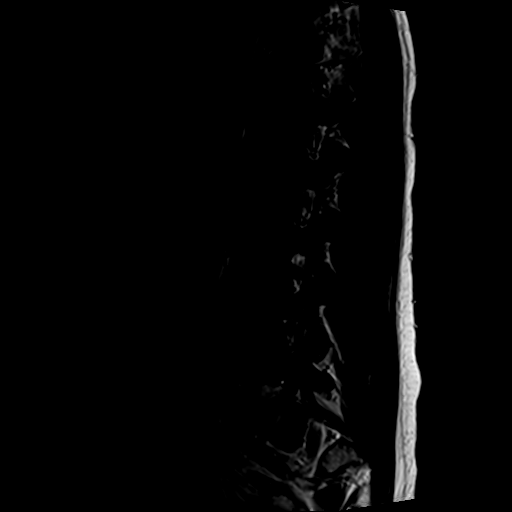
[im 13/13]
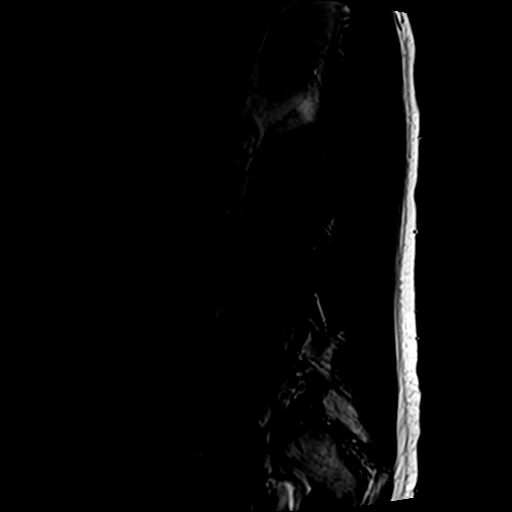

[Series 6: T2 · axial · 4.0mm · 0.70mm/px · z∈[-97,+80]mm · 9 of 33 slices shown]
[im 1/33]
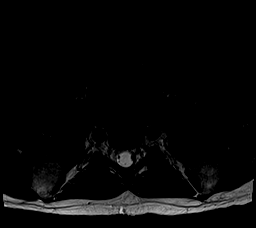
[im 5/33]
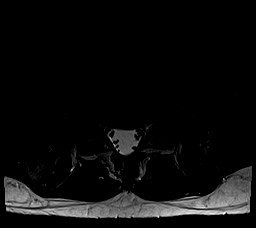
[im 10/33]
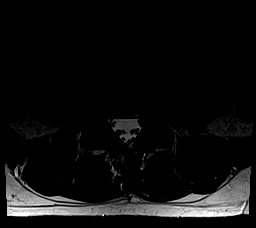
[im 14/33]
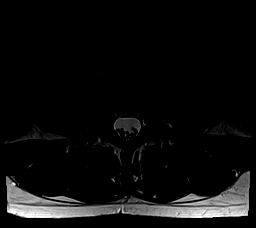
[im 17/33]
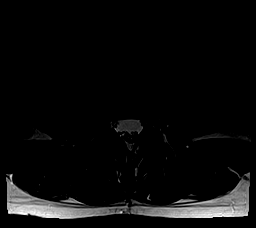
[im 19/33]
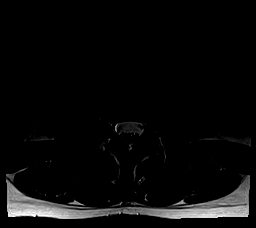
[im 23/33]
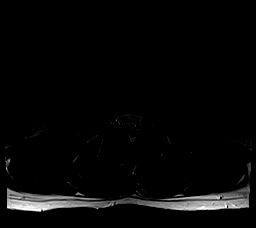
[im 28/33]
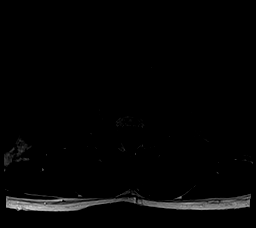
[im 33/33]
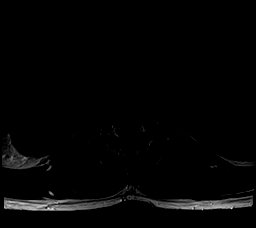

[Series 7: T1 · axial · 4.0mm · 0.35mm/px · z∈[-97,+55]mm · 5 of 33 slices shown (2 of 2)]
[im 1/33]
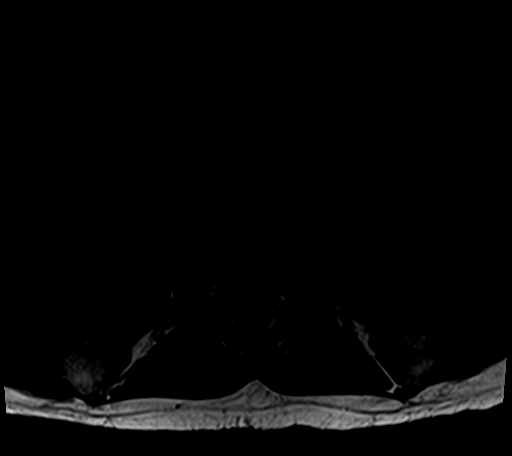
[im 5/33]
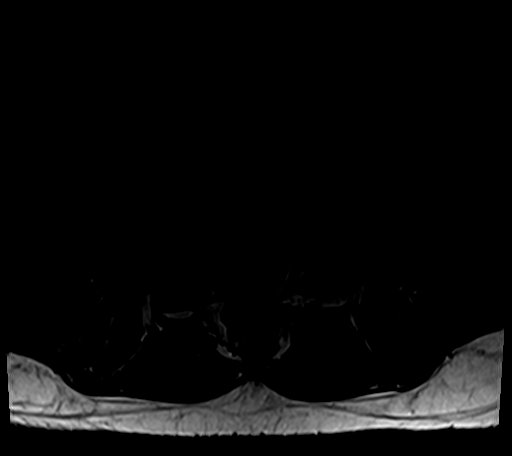
[im 10/33]
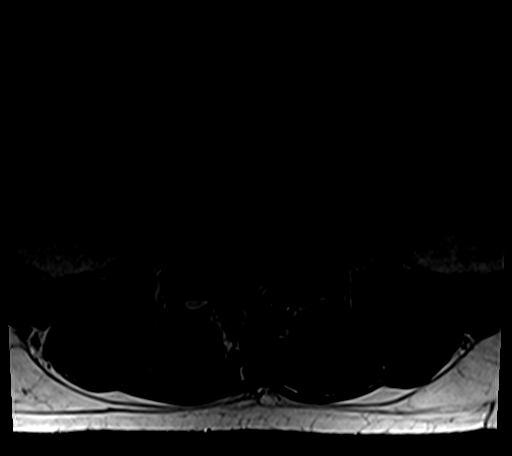
[im 17/33]
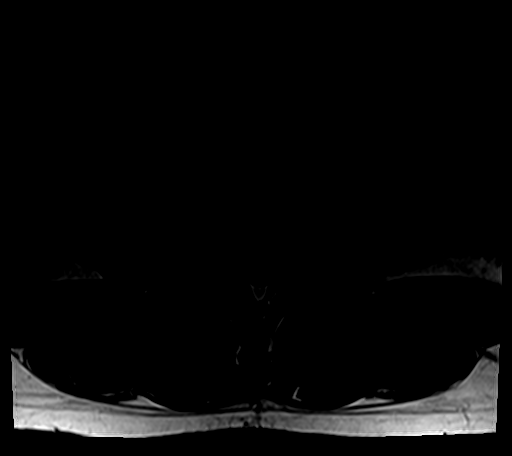
[im 28/33]
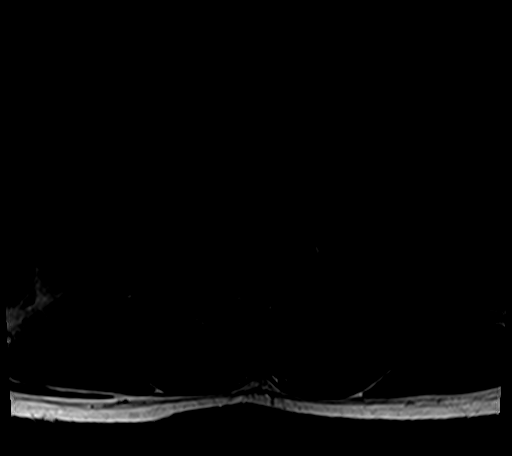

[26 of 48 positions shown; findings below may reference images not displayed]

FINDINGS: Segmentation: Normal. The lowest disc space is considered to be
L5-S1.

Alignment:  Normal

Vertebrae: No acute compression fracture, facet edema or focal
marrow lesion.

Conus medullaris: Extends to the L1 level and appears normal.

Paraspinal and other soft tissues: The visualized aorta, IVC and
iliac vessels are normal. The visualized retroperitoneal organs and
paraspinal soft tissues are normal.

Disc levels:

T12-L1: Evaluated on sagittal images only. No significant disc
herniation, spinal canal stenosis or neuroforaminal narrowing.

L1-L2: Normal disc space and facet joints. No spinal canal stenosis.
No neuroforaminal stenosis.

L2-L3: Normal disc space and facet joints. No spinal canal stenosis.
No neuroforaminal stenosis.

L3-L4: Normal disc space and facet joints. No spinal canal stenosis.
No neuroforaminal stenosis.

L4-L5: There is a disc bulge with a small left central annular
fissure. No spinal canal stenosis. No neuroforaminal stenosis.

L5-S1: Small central/left subarticular disc extrusion with minimal
inferior migration, mildly narrowing the left lateral recess. No
spinal canal stenosis. Mild bilateral neuroforaminal stenosis.
IMPRESSION: 1. Small disc extrusion at L5-S1 narrowing the left lateral recess.
Correlate for symptoms of left S1 radiculopathy.
2. L4-L5 disc bulge with small left central annular fissure, without
associated spinal canal or neural foraminal stenosis.

## 2017-10-02 ENCOUNTER — Other Ambulatory Visit: Payer: Self-pay | Admitting: Orthopedic Surgery

## 2017-10-02 DIAGNOSIS — R52 Pain, unspecified: Secondary | ICD-10-CM

## 2017-10-02 DIAGNOSIS — R531 Weakness: Secondary | ICD-10-CM

## 2017-10-04 ENCOUNTER — Ambulatory Visit
Admission: RE | Admit: 2017-10-04 | Discharge: 2017-10-04 | Disposition: A | Discharge status: Home / Self Care | Payer: 59 | Source: Ambulatory Visit | Attending: Orthopedic Surgery | Admitting: Orthopedic Surgery

## 2017-10-04 DIAGNOSIS — R531 Weakness: Secondary | ICD-10-CM

## 2017-10-04 DIAGNOSIS — R600 Localized edema: Secondary | ICD-10-CM | POA: Diagnosis not present

## 2017-10-04 DIAGNOSIS — R52 Pain, unspecified: Secondary | ICD-10-CM

## 2017-10-05 DIAGNOSIS — M898X6 Other specified disorders of bone, lower leg: Secondary | ICD-10-CM | POA: Diagnosis not present

## 2017-10-05 DIAGNOSIS — M25562 Pain in left knee: Secondary | ICD-10-CM | POA: Diagnosis not present

## 2017-10-05 DIAGNOSIS — G8929 Other chronic pain: Secondary | ICD-10-CM | POA: Diagnosis not present

## 2017-10-06 DIAGNOSIS — R3915 Urgency of urination: Secondary | ICD-10-CM | POA: Diagnosis not present

## 2017-10-06 DIAGNOSIS — E349 Endocrine disorder, unspecified: Secondary | ICD-10-CM | POA: Diagnosis not present

## 2017-10-19 DIAGNOSIS — K648 Other hemorrhoids: Secondary | ICD-10-CM | POA: Diagnosis not present

## 2017-10-19 DIAGNOSIS — F5101 Primary insomnia: Secondary | ICD-10-CM | POA: Diagnosis not present

## 2017-10-19 DIAGNOSIS — Z23 Encounter for immunization: Secondary | ICD-10-CM | POA: Diagnosis not present

## 2017-10-25 ENCOUNTER — Encounter: Payer: Self-pay | Admitting: Gastroenterology

## 2017-11-20 ENCOUNTER — Encounter: Payer: Self-pay | Admitting: Gastroenterology

## 2017-11-24 ENCOUNTER — Encounter: Payer: Self-pay | Admitting: Gastroenterology

## 2017-11-24 ENCOUNTER — Ambulatory Visit: Payer: 59 | Admitting: Gastroenterology

## 2017-11-24 VITALS — BP 114/78 | HR 64 | Ht 69.78 in | Wt 190.4 lb

## 2017-11-24 DIAGNOSIS — K648 Other hemorrhoids: Secondary | ICD-10-CM | POA: Diagnosis not present

## 2017-11-24 DIAGNOSIS — K625 Hemorrhage of anus and rectum: Secondary | ICD-10-CM

## 2017-11-24 DIAGNOSIS — K5909 Other constipation: Secondary | ICD-10-CM | POA: Diagnosis not present

## 2017-11-24 NOTE — Progress Notes (Signed)
Don Grant    [id number]    [birth year]  Primary Care Physician:White, Caren Griffins, MD  Referring Physician: Harlan Stains, MD [address], [place] [address]  Chief complaint: Bright red blood per rectum, constipation  HPI: 47 year old male with history of cardiomyopathy here with complaints of intermittent bright red blood per rectum on and off for the past 2 years, he is having more frequent episodes.  He was prescribed hemorrhoidal cream by Dr. Dema Severin with some improvement but continues to have intermittent bright red blood per rectum.  He has occasional anal discomfort but denies any rectal pain or discomfort with bowel movement.  Also complains of intermittent constipation with hard stool, having to strain excessively. Denies any unintentional loss of weight or appetite.  No nausea, vomiting or abdominal pain.  He had a colonoscopy at [facility] about 5 years ago, report is not available to review during this visit  Echocardiogram November 2016 LVEF 55-60%   Outpatient Encounter Medications as of 11/24/2017  Medication Sig  . Cholecalciferol (VITAMIN D) 2000 units CAPS Take 2,000 Units by mouth daily.  . Coenzyme Q10 (CO Q 10 PO) Take 1 capsule by mouth daily.   . DOCOSAHEXAENOIC ACID PO Take 4 capsules by mouth daily.  . fluticasone (FLONASE) 50 MCG/ACT nasal spray Place 2 sprays into both nostrils daily. As needed for allergies  . Glucosamine-Chondroit-Vit C-Mn (GLUCOSAMINE CHONDR 1500 COMPLX PO) Take 3 tablets by mouth daily.  . Magnesium 200 MG TABS Take 400 mg by mouth daily.  . Melatonin 3 MG CAPS Take 1 capsule by mouth at bedtime.   . mexiletine (MEXITIL) 150 MG capsule Take 150 mg by mouth 3 (three) times daily.  . Multiple Vitamin (MULTIVITAMIN) LIQD Take 5 mLs by mouth daily.  Marland Kitchen PRESCRIPTION MEDICATION Apply 1 application topically daily. Testosterone Cream  . sildenafil (VIAGRA) 100 MG tablet Take 100 mg by mouth daily as needed for  erectile dysfunction.   No facility-administered encounter medications on file as of 11/24/2017.     Allergies as of 11/24/2017 - Review Complete 11/24/2017  Allergen Reaction Noted  . Phenytoin Other (See Comments) 06/18/2013    Past Medical History:  Diagnosis Date  . Balding    early ( saw Jarome Matin in the past chose not to stay on Propecia )  . Bulging lumbar disc   . Erectile dysfunction   . Hypogonadism, male   . IBS (irritable bowel syndrome)    diarrhea prone  . Low HDL (under 40)   . Myotonic dystrophy, type 1 ([place])    on Mexiletine followed  by Dr .Tamala Julian, and [facility] and at [facility]   . Sinus bradycardia    asymptomatic  . Tinnitus    (may be side effect of mexilitene)    Past Surgical History:  Procedure Laterality Date  . APICO ON TOOTH    . KNEE ARTHROSCOPY Left   . ROOT CANAL    . WISDOM TOOTH EXTRACTION      Family History  Problem Relation Age of Onset  . Depression Mother   . Obesity Mother   . Healthy Father   . Heart Problems Maternal Grandfather        CABG  . Arthritis Maternal Grandfather   . Cataracts Maternal Grandfather   . Other Paternal Grandmother        BRAIN TUMOR/BEIGN  . Cataracts Maternal Grandmother   . CAD Maternal Grandmother   .  Healthy Daughter   . Muscular dystrophy Sister        myotonic dystrophy  . Healthy Daughter     Social History   Socioeconomic History  . Marital status: Married    Spouse name: Not on file  . Number of children: 2  . Years of education: Not on file  . Highest education level: Not on file  Occupational History  . Occupation: Geographical information systems officer  . Financial resource strain: Not on file  . Food insecurity:    Worry: Not on file    Inability: Not on file  . Transportation needs:    Medical: Not on file    Non-medical: Not on file  Tobacco Use  . Smoking status: Never Smoker  . Smokeless tobacco: Never Used  Substance and Sexual Activity  . Alcohol use: Yes     Comment: patient drinks 1 glass of red wine occasionally  . Drug use: No  . Sexual activity: Not on file  Lifestyle  . Physical activity:    Days per week: Not on file    Minutes per session: Not on file  . Stress: Not on file  Relationships  . Social connections:    Talks on phone: Not on file    Gets together: Not on file    Attends religious service: Not on file    Active member of club or organization: Not on file    Attends meetings of clubs or organizations: Not on file    Relationship status: Not on file  . Intimate partner violence:    Fear of current or ex partner: Not on file    Emotionally abused: Not on file    Physically abused: Not on file    Forced sexual activity: Not on file  Other Topics Concern  . Not on file  Social History Narrative   Tobacco use    Cigarettes: never smoked    No smoking   Alcohol : yes,red wine-8oz every night    Recreational drug use :no   Exercise: cardio 3-4 times per week,30 -50 minutes   Strength training 3 times per week.   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  Positive for fatigue HENT: Negative.   Eyes: Negative for blurred vision.  Respiratory: Negative for cough, shortness of breath and wheezing.   Cardiovascular: Negative for chest pain and palpitations.  Gastrointestinal: as per HPI Genitourinary: Negative for dysuria, urgency, frequency and hematuria.  Musculoskeletal: Positive for myalgias, back pain and joint pain.  Skin: Negative for itching and rash.  Neurological: Negative for dizziness, tremors, focal weakness, seizures and loss of consciousness.    Endo/Heme/Allergies: Negative for seasonal allergies.  Psychiatric/Behavioral: Negative for depression, suicidal ideas and hallucinations.  All other systems reviewed and are negative.   Physical Exam: Vitals:   11/24/17 0827  BP: 114/78  Pulse: 64   Body mass index is 27.49 kg/m. Gen:      No acute distress HEENT:  EOMI, sclera anicteric Neck:     No masses; no thyromegaly Lungs:  Clear to auscultation bilaterally; normal respiratory effort CV:         Regular rate and rhythm; no murmurs Abd:      + bowel sounds; soft, non-tender; no palpable masses, no distension Ext:    No edema; adequate peripheral perfusion Skin:      Warm and dry; no rash Neuro: alert and oriented x 3 Psych: normal mood and affect Rectal exam: Normal anal sphincter tone, no anal fissure or external hemorrhoids Anoscopy: Small internal hemorrhoids, no active bleeding, normal dentate line, no visible nodules  Data Reviewed:  Reviewed labs, radiology imaging, old records and pertinent past GI work up   Assessment and Plan/Recommendations:  47 year old male with history of cardiomyopathy, myotonic muscular dystrophy type I, constipation here with complaints of intermittent bright red blood per rectum  Likely etiology of small-volume bright red blood per rectum is from internal hemorrhoids  Colonoscopy 5 years ago for evaluation of rectal bleeding was normal per patient.  Will obtain records of colonoscopy from [place] patient to avoid excessive straining during defecation  Start Benefiber 1 teaspoon 3 times daily with meals to improve constipation and also increase water intake to 60 ounces daily  Okay to use small pea-sized amount of Proctosol per rectum as needed, advised patient to avoid using it continuously for more than 5 to 7 days  If continues to have persistent bleeding, will consider hemorrhoidal band ligation after reviewing the colonoscopy report  Return in 2 to 3 months or  sooner if needed   K. Denzil Magnuson , MD [phone]    CC: Harlan Stains, MD

## 2017-11-24 NOTE — Patient Instructions (Signed)
We will obtain your records from [facility]  Take Benefiber 1 teaspoon three times with meals  Increase water intake 60 - 80 oz water   Follow up in 3 months   If you are age 47 or older, your body mass index should be between 23-30. Your Body mass index is 27.49 kg/m. If this is out of the aforementioned range listed, please consider follow up with your Primary Care Provider.  If you are age 39 or younger, your body mass index should be between 19-25. Your Body mass index is 27.49 kg/m. If this is out of the aformentioned range listed, please consider follow up with your Primary Care Provider.    Thank you for choosing [facility]  Karleen Hampshire Nandigam,MD

## 2017-12-05 ENCOUNTER — Encounter: Payer: Self-pay | Admitting: Gastroenterology

## 2017-12-18 NOTE — Progress Notes (Signed)
Cardiology Office Note:    Date:  12/19/2017   ID:  Don Grant, DOB 1970-06-17, MRN [id number]  PCP:  Don Stains, MD  Cardiologist:  Don Grooms, MD   Referring MD: Don Stains, MD   Chief Complaint  Patient presents with  . Advice Only    Don Grant    History of Present Illness:    Don Grant is a 47 y.o. male with a hx of myotonic muscular dystrophy, low HDL, sinus bradycardia who presents for follow-up.Follow-up is for monitoring of Don Grant therapy.  He feels well.  Exercising vigorously.  His sister, younger, died suddenly with the same diagnosis.  According to the patient Don Grant physician states that her myotonic dystrophy was much more advanced/severe than his.  No side effects to Don Grant.  He has not had syncope, dyspnea, palpitations, chest pain, or edema.  Past Medical History:  Diagnosis Date  . Balding    early ( saw Don Grant in the past chose not to stay on Don Grant )  . Bulging lumbar disc   . Erectile dysfunction   . Hypogonadism, male   . IBS (irritable bowel syndrome)    diarrhea prone  . Low HDL (under 40)   . Myotonic dystrophy, type 1 (Don Grant)    on Mexiletine followed  by Dr .Don Grant, and Don Grant and at [facility]   . Sinus bradycardia    asymptomatic  . Tinnitus    (may be side effect of mexilitene)    Past Surgical History:  Procedure Laterality Date  . APICO ON TOOTH    . KNEE ARTHROSCOPY Left   . ROOT CANAL    . WISDOM TOOTH EXTRACTION      Current Medications: Current Meds  Medication Sig  . Cholecalciferol (VITAMIN D) 2000 units CAPS Take 2,000 Units by mouth daily.  . Coenzyme Q10 (CO Q 10 PO) Take 1 capsule by mouth daily.   . DOCOSAHEXAENOIC ACID PO Take 4 capsules by mouth daily.  . fluticasone (FLONASE) 50 MCG/ACT nasal spray Place 2 sprays into both nostrils daily. As needed for allergies  . Glucosamine-Chondroit-Vit C-Mn (GLUCOSAMINE CHONDR 1500 COMPLX PO) Take 3 tablets by mouth daily.    . Magnesium 200 MG TABS Take 400 mg by mouth daily.  . Melatonin 3 MG CAPS Take 1 capsule by mouth at bedtime.   . mexiletine (Don Grant) 150 MG capsule Take 150 mg by mouth 3 (three) times daily.  . Multiple Vitamin (MULTIVITAMIN) LIQD Take 5 mLs by mouth daily.  Marland Kitchen PRESCRIPTION MEDICATION Apply 1 application topically daily. Testosterone Cream  . tadalafil (CIALIS) 5 MG tablet Take 5 mg by mouth daily.     Allergies:   Phenytoin   Social History   Socioeconomic History  . Marital status: Married    Spouse name: Not on file  . Number of children: 2  . Years of education: Not on file  . Highest education level: Not on file  Occupational History  . Occupation: Geographical information systems officer  . Financial resource strain: Not on file  . Food insecurity:    Worry: Not on file    Inability: Not on file  . Transportation needs:    Medical: Not on file    Non-medical: Not on file  Tobacco Use  . Smoking status: Never Smoker  . Smokeless tobacco: Never Used  Substance and Sexual Activity  . Alcohol use: Yes    Comment: patient drinks 1 glass of red wine occasionally  .  Drug use: No  . Sexual activity: Not on file  Lifestyle  . Physical activity:    Days per week: Not on file    Minutes per session: Not on file  . Stress: Not on file  Relationships  . Social connections:    Talks on phone: Not on file    Gets together: Not on file    Attends religious service: Not on file    Active member of club or organization: Not on file    Attends meetings of clubs or organizations: Not on file    Relationship status: Not on file  Other Topics Concern  . Not on file  Social History Narrative   Tobacco use    Cigarettes: never smoked    No smoking   Alcohol : yes,red wine-8oz every night    Recreational drug use :no   Exercise: cardio 3-4 times per week,30 -50 minutes   Strength training 3 times per week.   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  ROS:   Please see the history of present illness.    Has some muscle weakness and throat, face, and tongue.  Also some vision disturbance.  All other systems reviewed and are negative.  EKGs/Labs/Other Studies Reviewed:    The following studies were reviewed today: No new cardiac imaging studies or functional studies.  EKG:  EKG is performed today and demonstrates sinus bradycardia at 51 bpm and otherwise unremarkable.  The most recent prior EKG was performed in November 2018 and is unchanged compared to the present.  Recent Labs: No results found for requested labs within last 8760 hours.  Recent Lipid Panel No results found for: CHOL, TRIG, HDL, CHOLHDL, VLDL, LDLCALC, LDLDIRECT  Physical Exam:    VS:  BP 114/74   Pulse (!) 51   Ht 5' 9.78" (1.772 m)   Wt 194 lb 12.8 oz (88.4 kg)   SpO2 97%   BMI 28.13 kg/m     Wt Readings from Last 3 Encounters:  12/19/17 194 lb 12.8 oz (88.4 kg)  11/24/17 190 lb 6 oz (86.4 kg)  12/12/16 186 lb (84.4 kg)     GEN: Healthy-appearing.. No acute distress HEENT: Normal NECK: No JVD. LYMPHATICS: No lymphadenopathy CARDIAC: RRR.  No murmur, gallop, edema VASCULAR: Pulses 2+ bilateral carotid and radial, Bruits are not heard in carotids. RESPIRATORY:  Clear to auscultation without rales, wheezing or rhonchi  ABDOMEN: Soft,  non-tender, non-distended, No pulsatile mass, MUSCULOSKELETAL: No deformity  SKIN: Warm and dry NEUROLOGIC:  Alert and oriented x 3 PSYCHIATRIC:  Normal affect   ASSESSMENT:    1. Myotonic muscular dystrophy ([place])   2. Low HDL (under 40)   3. Medication monitoring encounter   4. Erectile dysfunction due to arterial insufficiency   5. Bradycardia  PLAN:    In order of problems listed above:  1. Clinically stable 2. LDL in April was 97 on no therapy.  None is recommended. 3. No EKG evidence of conduction abnormality or QT prolongation.  A recent electrolyte panel was unremarkable.  Plan clinical follow-up in 1 year.   Medication Adjustments/Labs and Tests Ordered: Current medicines are reviewed at length with the patient today.  Concerns regarding medicines are outlined above.  Orders Placed This Encounter  Procedures  . EKG 12-Lead   No orders of the defined types were placed in this encounter.   Patient Instructions  Medication Instructions:  Your physician recommends that you continue on your current medications as directed. Please refer to the Current Medication list given to you today.  If you need a refill on your cardiac medications before your next appointment, please call your pharmacy.   Lab work: None If you have labs (blood work) drawn today and your tests are completely normal, you will receive your results only by: Marland Kitchen MyChart Message (if you have MyChart) OR . A paper copy in the mail If you have any lab test that is abnormal or we need to change your treatment, we will call you to review the results.  Testing/Procedures: None  Follow-Up: At [facility], you and your health needs are our priority.  As part of our continuing mission to provide you with exceptional heart care, we have created designated Provider Care Teams.  These Care Teams include your primary Cardiologist (physician) and Advanced Practice Providers (APPs -  Physician Assistants and  Nurse Practitioners) who all work together to provide you with the care you need, when you need it. You will need a follow up appointment in 12 months.  Please call our office 2 months in advance to schedule this appointment.  You may see Don Grooms, MD or one of the following Advanced Practice Providers on your designated Care Team:   Truitt Merle, NP Cecilie Kicks, NP . Kathyrn Drown, NP  Any Other Special Instructions Will Be Listed Below (If Applicable).       Signed, Don Grooms, MD  12/19/2017 9:57 AM    [place]

## 2017-12-19 ENCOUNTER — Encounter: Payer: Self-pay | Admitting: Interventional Cardiology

## 2017-12-19 ENCOUNTER — Ambulatory Visit: Payer: 59 | Admitting: Interventional Cardiology

## 2017-12-19 VITALS — BP 114/74 | HR 51 | Ht 69.78 in | Wt 194.8 lb

## 2017-12-19 DIAGNOSIS — Z5181 Encounter for therapeutic drug level monitoring: Secondary | ICD-10-CM | POA: Diagnosis not present

## 2017-12-19 DIAGNOSIS — E786 Lipoprotein deficiency: Secondary | ICD-10-CM | POA: Diagnosis not present

## 2017-12-19 DIAGNOSIS — N5201 Erectile dysfunction due to arterial insufficiency: Secondary | ICD-10-CM | POA: Diagnosis not present

## 2017-12-19 DIAGNOSIS — R001 Bradycardia, unspecified: Secondary | ICD-10-CM | POA: Diagnosis not present

## 2017-12-19 DIAGNOSIS — G7111 Myotonic muscular dystrophy: Secondary | ICD-10-CM

## 2017-12-19 NOTE — Patient Instructions (Signed)

## 2018-07-10 ENCOUNTER — Telehealth: Payer: Self-pay | Admitting: Gastroenterology

## 2018-07-10 NOTE — Telephone Encounter (Signed)
Pt states that he has been experiencing rectal bleeding due to his hemorrhoids, he wants to know if he could have an in-office visit. Pls advise.

## 2018-07-10 NOTE — Telephone Encounter (Signed)
The pt was scheduled to see Dr Silverio Decamp to discuss banding he is aware and all questions answered

## 2018-08-02 ENCOUNTER — Telehealth: Payer: Self-pay | Admitting: *Deleted

## 2018-08-02 NOTE — Telephone Encounter (Signed)
Covid-19 screening questions   Do you now or have you had a fever in the last 14 days?  Do you have any respiratory symptoms of shortness of breath or cough now or in the last 14 days?  Do you have any family members or close contacts with diagnosed or suspected Covid-19 in the past 14 days?  Have you been tested for Covid-19 and found to be positive?       

## 2018-08-03 ENCOUNTER — Ambulatory Visit: Payer: 59 | Admitting: Gastroenterology

## 2019-04-25 NOTE — Progress Notes (Signed)
Cardiology Office Note:    Date:  04/26/2019   ID:  Don Grant, DOB Sep 27, 1970, MRN [id number]  PCP:  Harlan Stains, MD  Cardiologist:  Sinclair Grooms, MD   Referring MD: Harlan Stains, MD   Chief Complaint  Patient presents with  . Follow-up    Apnea  . Advice Only    Myotonic dystrophy    History of Present Illness:    Don Grant is a 49 y.o. male with a hx of myotonic muscular dystrophy, low HDL, sinus bradycardia, and mexitil therapy.  No major complaints.  His oldest daughter has now been diagnosed with myotonic dystrophy based upon genetic testing and symptoms involving her face and neck.  He feels stable.  He has no dyspnea, edema, orthopnea, or any evidence of cardiac involvement.  His wife is noted that he has apnea at night when he tries to sleep.  This does not awaken him from sleep but he is noticing that he has nocturia which is relatively new.  He occasionally has random episodes of upper left chest discomfort that seems to dissipate if he changes position.  It is never been precipitated by physical activity.  Past Medical History:  Diagnosis Date  . Balding    early ( saw Jarome Matin in the past chose not to stay on Propecia )  . Bulging lumbar disc   . Erectile dysfunction   . Hypogonadism, male   . IBS (irritable bowel syndrome)    diarrhea prone  . Low HDL (under 40)   . Myotonic dystrophy, type 1 ([place])    on Mexiletine followed  by Dr .Tamala Julian, and [facility] and at [facility]   . Sinus bradycardia    asymptomatic  . Tinnitus    (may be side effect of mexilitene)    Past Surgical History:  Procedure Laterality Date  . APICO ON TOOTH    . KNEE ARTHROSCOPY Left   . ROOT CANAL    . WISDOM TOOTH EXTRACTION      Current Medications: Current Meds  Medication Sig  . Cholecalciferol (VITAMIN D) 2000 units CAPS Take 5,000 Units by mouth daily.   . Coenzyme Q10 (CO Q 10 PO) Take 1 capsule by mouth daily.   . DOCOSAHEXAENOIC  ACID PO Take 3 capsules by mouth daily.   . fluticasone (FLONASE) 50 MCG/ACT nasal spray Place 2 sprays into both nostrils daily. As needed for allergies  . Glucosamine-Chondroit-Vit C-Mn (GLUCOSAMINE CHONDR 1500 COMPLX PO) Take 3 tablets by mouth daily.  . Magnesium 200 MG TABS Take 400 mg by mouth daily.  . Melatonin 3 MG CAPS Take 1 capsule by mouth at bedtime.   . mexiletine (MEXITIL) 150 MG capsule Take 150 mg by mouth 3 (three) times daily.  . Multiple Vitamin (MULTIVITAMIN) LIQD Take 5 mLs by mouth daily.  . tadalafil (CIALIS) 5 MG tablet Take 5 mg by mouth daily as needed for erectile dysfunction.      Allergies:   Phenytoin   Social History   Socioeconomic History  . Marital status: Married    Spouse name: Not on file  . Number of children: 2  . Years of education: Not on file  . Highest education level: Not on file  Occupational History  . Occupation: Press photographer  Tobacco Use  . Smoking status: Never Smoker  . Smokeless tobacco: Never Used  Substance and Sexual Activity  . Alcohol use: Yes    Comment: patient drinks 1 glass of red wine  occasionally  . Drug use: No  . Sexual activity: Not on file  Other Topics Concern  . Not on file  Social History Narrative   Tobacco use    Cigarettes: never smoked    No smoking   Alcohol : yes,red wine-8oz every night    Recreational drug use :no   Exercise: cardio 3-4 times per week,30 -50 minutes   Strength training 3 times per week.   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 Difficulty of Paying Living Expenses:   Food Insecurity:   . Worried About Charity fundraiser in the Last Year:   . Academic librarian in the Last Year:   Transportation Needs:   . Film/video editor (Medical):   Marland Kitchen Lack of Transportation (Non-Medical):   Physical Activity:   . Days of Exercise per Week:   . Minutes of Exercise per Session:   Stress:   . Feeling of Stress :   Social Connections:   . Frequency of Communication with Friends and Family:   . Frequency of Social Gatherings with Friends and Family:   . Attends Religious Services:   . Active Member of Clubs or Organizations:   . Attends Archivist Meetings:   Marland Kitchen Marital Status:      Family History: The patient's family history includes Arthritis in his maternal grandfather; CAD in his maternal grandmother; Cataracts in his maternal grandfather and maternal grandmother; Depression in his mother; Healthy in his daughter, daughter, and father; Heart Problems in his maternal grandfather; Muscular dystrophy in his sister; Obesity in his mother; Other in his paternal grandmother.  ROS:   Please see the history of present illness.    Has lumbar disc disease.  Chronic back discomfort. All other systems reviewed and are negative.  EKGs/Labs/Other Studies Reviewed:    The following studies were reviewed today: No new data.  Probably needs to have an echocardiogram done sometime in the next year or 2. 2D Doppler echocardiogram 2016: Study Conclusions   - Left ventricle: The cavity size was normal. Systolic function was  normal. The estimated ejection fraction was in the range of 55%  to 60%. Wall motion was normal; there were no regional wall  motion abnormalities.  - Left atrium: The atrium was mildly dilated.  - Atrial septum: No defect or patent foramen ovale was identified.   EKG:  EKG sinus bradycardia, normal appearance, unchanged from EKG tracing in 2019.  Recent Labs: No results found for requested labs within last 8760 hours.  Recent Lipid Panel No results found for: CHOL, TRIG, HDL, CHOLHDL, VLDL, LDLCALC,  LDLDIRECT  Physical Exam:    VS:  BP 126/84   Pulse (!) 51   Ht 5' 9"  (1.753 m)   Wt 190 lb 9.6 oz (86.5 kg)   SpO2 98%   BMI 28.15 kg/m     Wt Readings from Last 3 Encounters:  04/26/19 190 lb 9.6 oz (86.5 kg)  12/19/17 194 lb 12.8 oz (88.4 kg)  11/24/17 190 lb 6 oz (86.4 kg)  GEN: Healthy-appearing. No acute distress HEENT: Normal NECK: No JVD. LYMPHATICS: No lymphadenopathy CARDIAC:  RRR without murmur, gallop, or edema. VASCULAR:  Normal Pulses. No bruits. RESPIRATORY:  Clear to auscultation without rales, wheezing or rhonchi  ABDOMEN: Soft, non-tender, non-distended, No pulsatile mass, MUSCULOSKELETAL: No deformity  SKIN: Warm and dry NEUROLOGIC:  Alert and oriented x 3.  Slight slurring of speech. PSYCHIATRIC:  Normal affect   ASSESSMENT:    1. Myotonic muscular dystrophy ([place])   2. Low HDL (under 40)   3. Erectile dysfunction due to arterial insufficiency   4. Bradycardia   5. Medication monitoring encounter   6. Educated about COVID-19 virus infection   7. Apnea    PLAN:    In order of problems listed above:  1. There is no clinical evidence of left ventricular dysfunction.  We will review the [place] records.  If he has not had an echocardiogram done anytime in the last 5 years, we will do that sometime before the next visit. 2. Most recent lipids are mildly abnormal.  Not at a level where I feel medication initiation is indicated.  At some point we may need to do a coronary calcium score. 3. Did not discuss on this visit 4. Unchanged.  Continue Mexitil. 5. Clinical evidence of toxicity. 6. I encourage the COVID-19 vaccine for him and family.  He will continue to practice social distancing. 7. Sleep study should be done.  Perhaps he has apnea related to myotonic dystrophy and airway obstruction during sleep.   Medication Adjustments/Labs and Tests Ordered: Current medicines are reviewed at length with the patient today.  Concerns regarding medicines are  outlined above.  Orders Placed This Encounter  Procedures  . EKG 12-Lead  . Split night study   No orders of the defined types were placed in this encounter.   Patient Instructions  Medication Instructions:  Your physician recommends that you continue on your current medications as directed. Please refer to the Current Medication list given to you today.  *If you need a refill on your cardiac medications before your next appointment, please call your pharmacy*   Lab Work: None If you have labs (blood work) drawn today and your tests are completely normal, you will receive your results only by: Marland Kitchen MyChart Message (if you have MyChart) OR . A paper copy in the mail If you have any lab test that is abnormal or we need to change your treatment, we will call you to review the results.   Testing/Procedures: Your physician has recommended that you have a sleep study. This test records several body functions during sleep, including: brain activity, eye movement, oxygen and carbon dioxide blood levels, heart rate and rhythm, breathing rate and rhythm, the flow of air through your mouth and nose, snoring, body muscle movements, and chest and belly movement.    Follow-Up: At [facility], you and your health needs are our priority.  As part of our continuing mission to provide you with exceptional heart care, we have created designated Provider Care Teams.  These Care Teams include your primary Cardiologist (physician) and Advanced Practice Providers (APPs -  Physician Assistants and Nurse Practitioners) who all work together to provide you with the care you need, when you need it.  We recommend signing up for the patient portal called "MyChart".  Sign up information is provided on this After Visit Summary.  MyChart is used to connect with patients for Virtual Visits (Telemedicine).  Patients are able to view lab/test  results, encounter notes, upcoming appointments, etc.  Non-urgent messages  can be sent to your provider as well.   To learn more about what you can do with MyChart, go to [email or web address].    Your next appointment:   12 month(s)  The format for your next appointment:   In Person  Provider:   You may see Sinclair Grooms, MD or one of the following Advanced Practice Providers on your designated Care Team:    Truitt Merle, NP  Cecilie Kicks, NP  Kathyrn Drown, NP    Other Instructions      Signed, Sinclair Grooms, MD  04/26/2019 9:00 AM    [place]

## 2019-04-26 ENCOUNTER — Encounter: Payer: Self-pay | Admitting: Interventional Cardiology

## 2019-04-26 ENCOUNTER — Ambulatory Visit: Payer: PRIVATE HEALTH INSURANCE | Admitting: Interventional Cardiology

## 2019-04-26 ENCOUNTER — Other Ambulatory Visit: Payer: Self-pay

## 2019-04-26 ENCOUNTER — Telehealth: Payer: Self-pay | Admitting: *Deleted

## 2019-04-26 VITALS — BP 126/84 | HR 51 | Ht 69.0 in | Wt 190.6 lb

## 2019-04-26 DIAGNOSIS — Z7189 Other specified counseling: Secondary | ICD-10-CM

## 2019-04-26 DIAGNOSIS — N5201 Erectile dysfunction due to arterial insufficiency: Secondary | ICD-10-CM

## 2019-04-26 DIAGNOSIS — Z5181 Encounter for therapeutic drug level monitoring: Secondary | ICD-10-CM

## 2019-04-26 DIAGNOSIS — R0681 Apnea, not elsewhere classified: Secondary | ICD-10-CM

## 2019-04-26 DIAGNOSIS — R001 Bradycardia, unspecified: Secondary | ICD-10-CM | POA: Diagnosis not present

## 2019-04-26 DIAGNOSIS — G7111 Myotonic muscular dystrophy: Secondary | ICD-10-CM

## 2019-04-26 DIAGNOSIS — E786 Lipoprotein deficiency: Secondary | ICD-10-CM | POA: Diagnosis not present

## 2019-04-26 NOTE — Patient Instructions (Addendum)
Medication Instructions:  Your physician recommends that you continue on your current medications as directed. Please refer to the Current Medication list given to you today.  *If you need a refill on your cardiac medications before your next appointment, please call your pharmacy*   Lab Work: None If you have labs (blood work) drawn today and your tests are completely normal, you will receive your results only by: Marland Kitchen MyChart Message (if you have MyChart) OR . A paper copy in the mail If you have any lab test that is abnormal or we need to change your treatment, we will call you to review the results.   Testing/Procedures: Your physician has recommended that you have a sleep study. This test records several body functions during sleep, including: brain activity, eye movement, oxygen and carbon dioxide blood levels, heart rate and rhythm, breathing rate and rhythm, the flow of air through your mouth and nose, snoring, body muscle movements, and chest and belly movement.   Your physician has requested that you have an echocardiogram 1-2 weeks prior to your visit with Dr. Tamala Julian in April 2022. Echocardiography is a painless test that uses sound waves to create images of your heart. It provides your doctor with information about the size and shape of your heart and how well your heart's chambers and valves are working. This procedure takes approximately one hour. There are no restrictions for this procedure.      Follow-Up: At [facility], you and your health needs are our priority.  As part of our continuing mission to provide you with exceptional heart care, we have created designated Provider Care Teams.  These Care Teams include your primary Cardiologist (physician) and Advanced Practice Providers (APPs -  Physician Assistants and Nurse Practitioners) who all work together to provide you with the care you need, when you need it.  We recommend signing up for the patient portal called  "MyChart".  Sign up information is provided on this After Visit Summary.  MyChart is used to connect with patients for Virtual Visits (Telemedicine).  Patients are able to view lab/test results, encounter notes, upcoming appointments, etc.  Non-urgent messages can be sent to your provider as well.   To learn more about what you can do with MyChart, go to [email or web address].    Your next appointment:   12 month(s)  The format for your next appointment:   In Person  Provider:   You may see Sinclair Grooms, MD or one of the following Advanced Practice Providers on your designated Care Team:    Truitt Merle, NP  Cecilie Kicks, NP  Kathyrn Drown, NP    Other Instructions

## 2019-04-26 NOTE — Telephone Encounter (Signed)
Sent to sleep pool.

## 2019-04-26 NOTE — Telephone Encounter (Signed)
-----   Message from Loren Racer, RN sent at 04/26/2019  8:58 AM EDT ----- Sleep study ordered

## 2019-05-21 ENCOUNTER — Telehealth: Payer: Self-pay

## 2019-05-21 NOTE — Telephone Encounter (Signed)
MD ordered split night sleep study.  Per [place] study does not require prior auth.  Ref [place] 05/21/2019. Message sent to Gershon Cull for scheduling.

## 2019-05-27 NOTE — Telephone Encounter (Signed)
Patient is scheduled for lab study on 06/04/19, pt is scheduled for COVID screening on 06/01/19 11:00 prior to SS.  Patient understands his sleep study will be done at [facility] sleep lab. Patient understands he will receive a sleep packet in a week or so. Patient understands to call if he does not receive the sleep packet in a timely manner. Patient disagrees with treatment and stated due to his insurance he will have to have his study at [facility].

## 2019-05-28 NOTE — Telephone Encounter (Addendum)
Patient disagrees with treatment and stated due to his insurance he will have to have his study at [place]. Sleep study cancelled 05/28/19. He is completing study at [facility] through [facility].)

## 2019-06-01 ENCOUNTER — Other Ambulatory Visit (HOSPITAL_COMMUNITY): Payer: PRIVATE HEALTH INSURANCE

## 2019-06-04 ENCOUNTER — Encounter (HOSPITAL_BASED_OUTPATIENT_CLINIC_OR_DEPARTMENT_OTHER): Payer: PRIVATE HEALTH INSURANCE | Admitting: Cardiology

## 2019-08-24 ENCOUNTER — Emergency Department (HOSPITAL_COMMUNITY)
Admission: EM | Admit: 2019-08-24 | Discharge: 2019-08-25 | Disposition: A | Discharge status: Home / Self Care | Payer: PRIVATE HEALTH INSURANCE | Attending: Emergency Medicine | Admitting: Emergency Medicine

## 2019-08-24 ENCOUNTER — Encounter (HOSPITAL_COMMUNITY): Payer: Self-pay | Admitting: *Deleted

## 2019-08-24 ENCOUNTER — Other Ambulatory Visit: Payer: Self-pay

## 2019-08-24 DIAGNOSIS — R197 Diarrhea, unspecified: Secondary | ICD-10-CM | POA: Diagnosis not present

## 2019-08-24 DIAGNOSIS — R1110 Vomiting, unspecified: Secondary | ICD-10-CM | POA: Insufficient documentation

## 2019-08-24 DIAGNOSIS — Z5321 Procedure and treatment not carried out due to patient leaving prior to being seen by health care provider: Secondary | ICD-10-CM | POA: Insufficient documentation

## 2019-08-24 DIAGNOSIS — R109 Unspecified abdominal pain: Secondary | ICD-10-CM | POA: Diagnosis present

## 2019-08-24 DIAGNOSIS — M545 Low back pain: Secondary | ICD-10-CM | POA: Diagnosis not present

## 2019-08-24 MED ORDER — SODIUM CHLORIDE 0.9% FLUSH: 3.0000 mL | Freq: Once | INTRAVENOUS | Status: DC

## 2019-08-24 MED ORDER — ONDANSETRON 4 MG PO TBDP
4.0000 mg | ORAL_TABLET | Freq: Once | ORAL | Status: AC | PRN
  Administered 2019-08-24: 4 mg via ORAL
  Filled 2019-08-24: qty 1

## 2019-08-24 NOTE — ED Triage Notes (Signed)
Pt states started having mid abdominal pain and lower back pain, with vomiting and loose stool.  Pt states abdomen is bloated.

## 2019-08-25 LAB — URINALYSIS, ROUTINE W REFLEX MICROSCOPIC
Bilirubin Urine: NEGATIVE
Glucose, UA: NEGATIVE mg/dL
Hgb urine dipstick: NEGATIVE
Ketones, ur: 20 mg/dL — AB
Leukocytes,Ua: NEGATIVE
Nitrite: NEGATIVE
Protein, ur: NEGATIVE mg/dL
Specific Gravity, Urine: 1.027 (ref 1.005–1.030)
pH: 7 (ref 5.0–8.0)

## 2019-08-25 LAB — COMPREHENSIVE METABOLIC PANEL
ALT: 26 U/L (ref 0–44)
AST: 29 U/L (ref 15–41)
Albumin: 4.4 g/dL (ref 3.5–5.0)
Alkaline Phosphatase: 75 U/L (ref 38–126)
Anion gap: 13 (ref 5–15)
BUN: 28 mg/dL — ABNORMAL HIGH (ref 6–20)
CO2: 28 mmol/L (ref 22–32)
Calcium: 9.8 mg/dL (ref 8.9–10.3)
Chloride: 99 mmol/L (ref 98–111)
Creatinine, Ser: 1.4 mg/dL — ABNORMAL HIGH (ref 0.61–1.24)
GFR calc Af Amer: >60 mL/min (ref >60)
GFR calc non Af Amer: 59 mL/min — ABNORMAL LOW (ref >60)
Glucose, Bld: 127 mg/dL — ABNORMAL HIGH (ref 70–99)
Potassium: 3.9 mmol/L (ref 3.5–5.1)
Sodium: 140 mmol/L (ref 135–145)
Total Bilirubin: 1.1 mg/dL (ref 0.3–1.2)
Total Protein: 7.2 g/dL (ref 6.5–8.1)

## 2019-08-25 LAB — CBC
HCT: 49.8 % (ref 39.0–52.0)
Hemoglobin: 16 g/dL (ref 13.0–17.0)
MCH: 27.6 pg (ref 26.0–34.0)
MCHC: 32.1 g/dL (ref 30.0–36.0)
MCV: 85.9 fL (ref 80.0–100.0)
Platelets: 205 10*3/uL (ref 150–400)
RBC: 5.8 MIL/uL (ref 4.22–5.81)
RDW: 12.8 % (ref 11.5–15.5)
WBC: 6.4 10*3/uL (ref 4.0–10.5)
nRBC: 0 % (ref 0.0–0.2)

## 2019-08-25 LAB — LIPASE, BLOOD: Lipase: 23 U/L (ref 11–51)

## 2019-08-25 NOTE — ED Notes (Signed)
Pt called x2 with no response

## 2019-09-19 LAB — EXTERNAL GENERIC LAB PROCEDURE: COLOGUARD: NEGATIVE

## 2020-04-13 ENCOUNTER — Telehealth (HOSPITAL_COMMUNITY): Payer: Self-pay | Admitting: Interventional Cardiology

## 2020-04-13 ENCOUNTER — Encounter (HOSPITAL_COMMUNITY): Payer: Self-pay

## 2020-04-13 NOTE — Telephone Encounter (Signed)
I called patient to schedule yearly echocardiogram and patient states that he is following cardiologist at [facility] will be removed from the ECHO WQ.

## 2022-07-30 LAB — COLOGUARD

## 2022-08-13 LAB — COLOGUARD: COLOGUARD: NEGATIVE
